# Patient Record
Sex: Male | Born: 1950 | Race: White | Hispanic: No | Smoking: Never smoker
Health system: Southern US, Community
[De-identification: ages and names within clinical notes are randomized; demographics above are authoritative.]

## PROBLEM LIST (undated history)

## (undated) DIAGNOSIS — E785 Hyperlipidemia, unspecified: Secondary | ICD-10-CM

## (undated) DIAGNOSIS — E1110 Type 2 diabetes mellitus with ketoacidosis without coma: Secondary | ICD-10-CM

## (undated) DIAGNOSIS — I10 Essential (primary) hypertension: Secondary | ICD-10-CM

## (undated) DIAGNOSIS — I639 Cerebral infarction, unspecified: Secondary | ICD-10-CM

## (undated) DIAGNOSIS — N529 Male erectile dysfunction, unspecified: Secondary | ICD-10-CM

## (undated) DIAGNOSIS — F4323 Adjustment disorder with mixed anxiety and depressed mood: Secondary | ICD-10-CM

## (undated) DIAGNOSIS — E119 Type 2 diabetes mellitus without complications: Principal | ICD-10-CM

## (undated) DIAGNOSIS — R972 Elevated prostate specific antigen [PSA]: Secondary | ICD-10-CM

## (undated) HISTORY — DX: Type 2 diabetes mellitus without complications: E11.9

## (undated) HISTORY — DX: Elevated prostate specific antigen (PSA): R97.20

## (undated) HISTORY — DX: Adjustment disorder with mixed anxiety and depressed mood: F43.23

## (undated) HISTORY — DX: Male erectile dysfunction, unspecified: N52.9

## (undated) HISTORY — DX: Essential (primary) hypertension: I10

## (undated) HISTORY — DX: Hyperlipidemia, unspecified: E78.5

---

## 2001-11-10 ENCOUNTER — Encounter: Admission: RE | Admit: 2001-11-10 | Discharge: 2002-02-08 | Payer: Self-pay | Admitting: Internal Medicine

## 2004-04-05 ENCOUNTER — Ambulatory Visit: Payer: Self-pay | Admitting: Internal Medicine

## 2004-04-12 ENCOUNTER — Ambulatory Visit (HOSPITAL_COMMUNITY): Admission: RE | Admit: 2004-04-12 | Discharge: 2004-04-12 | Payer: Self-pay | Admitting: Internal Medicine

## 2004-04-25 ENCOUNTER — Ambulatory Visit: Payer: Self-pay | Admitting: Endocrinology

## 2004-05-30 ENCOUNTER — Ambulatory Visit: Payer: Self-pay | Admitting: Internal Medicine

## 2004-07-06 ENCOUNTER — Ambulatory Visit: Payer: Self-pay | Admitting: Endocrinology

## 2005-10-10 ENCOUNTER — Ambulatory Visit: Payer: Self-pay | Admitting: Internal Medicine

## 2006-11-25 ENCOUNTER — Telehealth: Payer: Self-pay | Admitting: Internal Medicine

## 2006-12-12 ENCOUNTER — Telehealth: Payer: Self-pay | Admitting: Internal Medicine

## 2006-12-24 ENCOUNTER — Ambulatory Visit: Payer: Self-pay | Admitting: Internal Medicine

## 2006-12-24 DIAGNOSIS — E785 Hyperlipidemia, unspecified: Secondary | ICD-10-CM | POA: Insufficient documentation

## 2006-12-24 DIAGNOSIS — I10 Essential (primary) hypertension: Secondary | ICD-10-CM

## 2006-12-24 DIAGNOSIS — F411 Generalized anxiety disorder: Secondary | ICD-10-CM

## 2006-12-24 DIAGNOSIS — E119 Type 2 diabetes mellitus without complications: Secondary | ICD-10-CM

## 2006-12-24 DIAGNOSIS — F329 Major depressive disorder, single episode, unspecified: Secondary | ICD-10-CM

## 2006-12-24 HISTORY — DX: Essential (primary) hypertension: I10

## 2006-12-24 HISTORY — DX: Hyperlipidemia, unspecified: E78.5

## 2006-12-24 HISTORY — DX: Type 2 diabetes mellitus without complications: E11.9

## 2006-12-25 ENCOUNTER — Encounter: Payer: Self-pay | Admitting: Internal Medicine

## 2006-12-25 DIAGNOSIS — R972 Elevated prostate specific antigen [PSA]: Secondary | ICD-10-CM

## 2006-12-25 HISTORY — DX: Elevated prostate specific antigen (PSA): R97.20

## 2006-12-27 ENCOUNTER — Ambulatory Visit: Payer: Self-pay | Admitting: Endocrinology

## 2007-02-03 ENCOUNTER — Encounter: Payer: Self-pay | Admitting: Internal Medicine

## 2007-06-16 ENCOUNTER — Emergency Department (HOSPITAL_COMMUNITY): Admission: EM | Admit: 2007-06-16 | Discharge: 2007-06-16 | Payer: Self-pay | Admitting: Emergency Medicine

## 2007-06-20 ENCOUNTER — Telehealth: Payer: Self-pay | Admitting: Internal Medicine

## 2007-06-24 ENCOUNTER — Ambulatory Visit: Payer: Self-pay | Admitting: Internal Medicine

## 2007-06-24 DIAGNOSIS — R634 Abnormal weight loss: Secondary | ICD-10-CM | POA: Insufficient documentation

## 2007-06-24 LAB — CONVERTED CEMR LAB
AST: 23 units/L (ref 0–37)
Alkaline Phosphatase: 59 units/L (ref 39–117)
Basophils Absolute: 0 10*3/uL (ref 0.0–0.1)
Basophils Relative: 0.5 % (ref 0.0–1.0)
Bilirubin, Direct: 0.1 mg/dL (ref 0.0–0.3)
Calcium: 8.9 mg/dL (ref 8.4–10.5)
Chloride: 105 meq/L (ref 96–112)
Eosinophils Absolute: 0.1 10*3/uL (ref 0.0–0.7)
Glucose, Bld: 105 mg/dL — ABNORMAL HIGH (ref 70–99)
HCT: 41.7 % (ref 39.0–52.0)
LDL Cholesterol: 89 mg/dL (ref 0–99)
MCHC: 33.4 g/dL (ref 30.0–36.0)
Monocytes Absolute: 0.4 10*3/uL (ref 0.1–1.0)
Monocytes Relative: 7 % (ref 3.0–12.0)
Neutrophils Relative %: 70.4 % (ref 43.0–77.0)
Potassium: 4.3 meq/L (ref 3.5–5.1)
RBC: 4.75 M/uL (ref 4.22–5.81)
Sodium: 139 meq/L (ref 135–145)
Total CHOL/HDL Ratio: 3.2
WBC: 5.6 10*3/uL (ref 4.5–10.5)

## 2008-07-26 ENCOUNTER — Telehealth: Payer: Self-pay | Admitting: Internal Medicine

## 2008-08-20 ENCOUNTER — Ambulatory Visit: Payer: Self-pay | Admitting: Internal Medicine

## 2008-08-20 LAB — CONVERTED CEMR LAB
AST: 25 units/L (ref 0–37)
BUN: 18 mg/dL (ref 6–23)
CO2: 30 meq/L (ref 19–32)
Chloride: 105 meq/L (ref 96–112)
Cholesterol: 157 mg/dL (ref 0–200)
Creatinine, Ser: 1 mg/dL (ref 0.4–1.5)
Creatinine,U: 149.2 mg/dL
Eosinophils Relative: 2.5 % (ref 0.0–5.0)
GFR calc non Af Amer: 81.17 mL/min (ref >60)
Hemoglobin: 14.6 g/dL (ref 13.0–17.0)
Ketones, ur: NEGATIVE mg/dL
Leukocytes, UA: NEGATIVE
Lymphs Abs: 1.1 10*3/uL (ref 0.7–4.0)
Microalb Creat Ratio: 5.4 mg/g (ref 0.0–30.0)
Microalb, Ur: 0.8 mg/dL (ref 0.0–1.9)
Monocytes Absolute: 0.4 10*3/uL (ref 0.1–1.0)
Neutrophils Relative %: 66.5 % (ref 43.0–77.0)
Nitrite: NEGATIVE
Platelets: 194 10*3/uL (ref 150.0–400.0)
RBC: 5.03 M/uL (ref 4.22–5.81)
RDW: 12.4 % (ref 11.5–14.6)
Specific Gravity, Urine: 1.015 (ref 1.000–1.030)
Total Bilirubin: 1 mg/dL (ref 0.3–1.2)
Total CHOL/HDL Ratio: 3
Total Protein: 7 g/dL (ref 6.0–8.3)
Triglycerides: 74 mg/dL (ref 0.0–149.0)
VLDL: 14.8 mg/dL (ref 0.0–40.0)

## 2008-08-26 ENCOUNTER — Ambulatory Visit: Payer: Self-pay | Admitting: Internal Medicine

## 2008-08-26 DIAGNOSIS — Z860100 Personal history of colon polyps, unspecified: Secondary | ICD-10-CM | POA: Insufficient documentation

## 2009-01-18 ENCOUNTER — Encounter (INDEPENDENT_AMBULATORY_CARE_PROVIDER_SITE_OTHER): Payer: Self-pay | Admitting: *Deleted

## 2009-02-17 ENCOUNTER — Ambulatory Visit: Payer: Self-pay | Admitting: Internal Medicine

## 2009-02-17 LAB — CONVERTED CEMR LAB
CO2: 30 meq/L (ref 19–32)
Calcium: 8.8 mg/dL (ref 8.4–10.5)
Cholesterol: 148 mg/dL (ref 0–200)
Creatinine, Ser: 1 mg/dL (ref 0.4–1.5)
Glucose, Bld: 107 mg/dL — ABNORMAL HIGH (ref 70–99)
HDL: 56.8 mg/dL (ref >39.00)
LDL Cholesterol: 77 mg/dL (ref 0–99)
Potassium: 4.5 meq/L (ref 3.5–5.1)
Sodium: 138 meq/L (ref 135–145)
VLDL: 14 mg/dL (ref 0.0–40.0)

## 2009-02-23 ENCOUNTER — Ambulatory Visit: Payer: Self-pay | Admitting: Internal Medicine

## 2009-08-18 ENCOUNTER — Ambulatory Visit: Payer: Self-pay | Admitting: Internal Medicine

## 2009-08-18 LAB — CONVERTED CEMR LAB
Alkaline Phosphatase: 63 units/L (ref 39–117)
Basophils Relative: 0.3 % (ref 0.0–3.0)
Bilirubin, Direct: 0.3 mg/dL (ref 0.0–0.3)
CO2: 31 meq/L (ref 19–32)
Chloride: 103 meq/L (ref 96–112)
Cholesterol: 164 mg/dL (ref 0–200)
Creatinine,U: 141.2 mg/dL
Eosinophils Absolute: 0.1 10*3/uL (ref 0.0–0.7)
Eosinophils Relative: 2.3 % (ref 0.0–5.0)
Hemoglobin, Urine: NEGATIVE
Hgb A1c MFr Bld: 7.9 % — ABNORMAL HIGH (ref 4.6–6.5)
LDL Cholesterol: 94 mg/dL (ref 0–99)
Lymphs Abs: 1 10*3/uL (ref 0.7–4.0)
MCV: 85 fL (ref 78.0–100.0)
Microalb Creat Ratio: 0.4 mg/g (ref 0.0–30.0)
Microalb, Ur: 0.5 mg/dL (ref 0.0–1.9)
Monocytes Relative: 12.1 % — ABNORMAL HIGH (ref 3.0–12.0)
Neutro Abs: 2.6 10*3/uL (ref 1.4–7.7)
Platelets: 177 10*3/uL (ref 150.0–400.0)
Potassium: 4.8 meq/L (ref 3.5–5.1)
RBC: 4.92 M/uL (ref 4.22–5.81)
Specific Gravity, Urine: 1.015 (ref 1.000–1.030)
TSH: 1.15 microintl units/mL (ref 0.35–5.50)
Total Protein, Urine: NEGATIVE mg/dL
Triglycerides: 104 mg/dL (ref 0.0–149.0)
VLDL: 20.8 mg/dL (ref 0.0–40.0)

## 2009-08-22 ENCOUNTER — Ambulatory Visit: Payer: Self-pay | Admitting: Internal Medicine

## 2010-01-29 ENCOUNTER — Encounter: Payer: Self-pay | Admitting: Internal Medicine

## 2010-02-05 LAB — CONVERTED CEMR LAB
ALT: 33 units/L (ref 0–53)
AST: 28 units/L (ref 0–37)
Albumin: 3.9 g/dL (ref 3.5–5.2)
Alkaline Phosphatase: 82 units/L (ref 39–117)
BUN: 11 mg/dL (ref 6–23)
Basophils Absolute: 0 10*3/uL (ref 0.0–0.1)
Basophils Relative: 0.6 % (ref 0.0–1.0)
Bilirubin Urine: NEGATIVE
Bilirubin, Direct: 0.2 mg/dL (ref 0.0–0.3)
CO2: 30 meq/L (ref 19–32)
Calcium: 9.1 mg/dL (ref 8.4–10.5)
Chloride: 99 meq/L (ref 96–112)
Cholesterol: 231 mg/dL (ref 0–200)
Creatinine, Ser: 0.9 mg/dL (ref 0.4–1.5)
Creatinine,U: 184.8 mg/dL
Direct LDL: 141.8 mg/dL
Eosinophils Absolute: 0.1 10*3/uL (ref 0.0–0.6)
Eosinophils Relative: 2.1 % (ref 0.0–5.0)
GFR calc Af Amer: 112 mL/min
GFR calc non Af Amer: 92 mL/min
Glucose, Bld: 225 mg/dL — ABNORMAL HIGH (ref 70–99)
HCT: 48.7 % (ref 39.0–52.0)
HDL: 54 mg/dL (ref >39.0)
Hemoglobin, Urine: NEGATIVE
Hemoglobin: 16.4 g/dL (ref 13.0–17.0)
Hgb A1c MFr Bld: 12.5 % — ABNORMAL HIGH (ref 4.6–6.0)
Ketones, ur: NEGATIVE mg/dL
Leukocytes, UA: NEGATIVE
Lymphocytes Relative: 21.5 % (ref 12.0–46.0)
MCHC: 33.6 g/dL (ref 30.0–36.0)
MCV: 85.1 fL (ref 78.0–100.0)
Microalb Creat Ratio: 10.3 mg/g (ref 0.0–30.0)
Microalb, Ur: 1.9 mg/dL (ref 0.0–1.9)
Monocytes Absolute: 0.3 10*3/uL (ref 0.2–0.7)
Monocytes Relative: 4.6 % (ref 3.0–11.0)
Neutro Abs: 4.5 10*3/uL (ref 1.4–7.7)
Neutrophils Relative %: 71.2 % (ref 43.0–77.0)
Nitrite: NEGATIVE
PSA: 3.02 ng/mL (ref 0.10–4.00)
Platelets: 232 10*3/uL (ref 150–400)
Potassium: 4.5 meq/L (ref 3.5–5.1)
RBC: 5.72 M/uL (ref 4.22–5.81)
RDW: 13.1 % (ref 11.5–14.6)
Sodium: 137 meq/L (ref 135–145)
Specific Gravity, Urine: 1.02 (ref 1.000–1.03)
TSH: 0.92 microintl units/mL (ref 0.35–5.50)
Total Bilirubin: 1.2 mg/dL (ref 0.3–1.2)
Total CHOL/HDL Ratio: 4.3
Total Protein: 7.5 g/dL (ref 6.0–8.3)
Triglycerides: 176 mg/dL — ABNORMAL HIGH (ref 0–149)
Urine Glucose: >=1000 mg/dL — CR
Urobilinogen, UA: 0.2 (ref 0.0–1.0)
VLDL: 35 mg/dL (ref 0–40)
WBC: 6.2 10*3/uL (ref 4.5–10.5)
pH: 6.5 (ref 5.0–8.0)

## 2010-02-07 NOTE — Assessment & Plan Note (Signed)
Summary: 6 MTH FU  STC   Vital Signs:  Patient profile:   60 year old male Height:      72 inches Weight:      181.50 pounds BMI:     24.70 O2 Sat:      97 % on Room air Temp:     98.3 degrees F oral Pulse rate:   83 / minute BP sitting:   112 / 68  (left arm) Cuff size:   regular  Vitals Entered By: Zella Ball Ewing CMA (AAMA) (August 22, 2009 8:05 AM)  O2 Flow:  Room air  Preventive Care Screening     declines colonoscopy due to cost   CC: 6 month followup/RE   CC:  6 month followup/RE.  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 Take 2 Tablet By Mouth Once A Day 2)  Lovastatin 40 Mg  Tabs (Lovastatin) .Marland Kitchen.. 1 By Mouth Once Daily 3)  Actos 45 Mg  Tabs (Pioglitazone  Hcl) .Marland Kitchen.. 1 By Mouth Once Daily 4)  Amaryl 4 Mg  Tabs (Glimepiride) .... 2 Tabs By Mouth Once Daily 5)  Ecotrin Low Strength 81 Mg  Tbec (Aspirin) .Marland Kitchen.. 1 By Mouth Qd 6)  Januvia 100 Mg  Tabs (Sitagliptin Phosphate) .Marland Kitchen.. 1 By Mouth Once Daily 7)  Lisinopril 5 Mg  Tabs (Lisinopril) .Marland Kitchen.. 1 By Mouth Once Daily  Current Medications (verified): 1)  Metformin Hcl 500 Mg Tb24 (Metformin Hcl) .... Take 2 Tablet By Mouth Once A Day 2)  Lovastatin 40 Mg  Tabs (Lovastatin) .Marland Kitchen.. 1 By Mouth Once Daily 3)  Actos 45 Mg  Tabs (Pioglitazone Hcl) .Marland Kitchen.. 1 By Mouth Once Daily 4)  Amaryl 4 Mg  Tabs (Glimepiride) .... 2 Tabs By Mouth Once Daily 5)  Ecotrin Low Strength 81 Mg  Tbec (Aspirin) .Marland Kitchen.. 1 By Mouth Qd 6)  Januvia 100 Mg  Tabs (Sitagliptin Phosphate) .Marland Kitchen.. 1 By Mouth Once Daily 7)  Lisinopril 5 Mg  Tabs (Lisinopril) .Marland Kitchen.. 1 By Mouth Once Daily 8)  Onetouch Ultra Test  Strp (Glucose Blood) .... Use Asd 1 Once Daily 9)  Lancets  Misc (Lancets) .... Use Asd 1 Once Daily  Allergies (verified): No  Known Drug Allergies  Past History:  Past Medical History: Last updated: 12/24/2006 Diabetes mellitus, type II Hypertension Hyperlipidemia Anxiety Depression  Past Surgical History: Last updated: 12/24/2006 Denies surgical history  Family History: Last updated: 12/27/2006 DM father with colon cancer, died age 53 mother with uterine cancer and dm  Social History: Last updated: 06/24/2007 Never Smoked Alcohol use-yes pt states  business conditions are poor/[employer] Married 2 children  Risk Factors: Smoking Status: never (12/24/2006)  Review of Systems  The patient denies anorexia, fever, vision loss, decreased hearing, hoarseness, chest pain, syncope, dyspnea on exertion, peripheral edema, prolonged cough, headaches, hemoptysis, abdominal pain, melena, hematochezia, severe indigestion/heartburn, hematuria, muscle weakness, suspicious skin lesions, transient blindness, difficulty  walking, depression, unusual weight change, abnormal bleeding, enlarged lymph nodes, and angioedema.         all otherwise negative per pt -   - declines tx for midl to mod depression  Physical Exam  General:  alert and well-developed.   Head:  normocephalic and atraumatic.   Eyes:  vision grossly intact, pupils equal, and pupils round.   Ears:  R ear normal and L ear normal.   Nose:  no external deformity and no nasal discharge.   Mouth:  no gingival abnormalities and pharynx pink and moist.   Neck:  supple and no masses.   Lungs:  normal respiratory effort and normal breath sounds.   Heart:  normal rate and regular rhythm.   Abdomen:  soft, non-tender, and normal bowel sounds.   Msk:  no joint tenderness and no joint swelling.   Extremities:  no edema, no erythema  Neurologic:  cranial nerves II-XII intact and strength normal in all extremities.   Skin:  color normal and no rashes.   Psych:  dysphoric affect and moderately anxious.     Impression & Recommendations:  Problem # 1:  Preventive Health Care (ICD-V70.0) Overall doing well, age appropriate education and counseling updated and referral for appropriate preventive services done unless declined, immunizations up to date or declined, diet counseling done if overweight, urged to quit smoking if smokes , most recent labs reviewed and current ordered if appropriate, ecg reviewed or declined (interpretation per ECG scanned in the EMR if done); information regarding Medicare Prevention requirements given if appropriate; speciality referrals updated as appropriate   Problem # 2:  DIABETES MELLITUS, TYPE II (ICD-250.00)  His updated medication list for this problem includes:    Metformin Hcl 500 Mg Tb24 (Metformin hcl) .Marland Kitchen... Take 2 tablet by mouth once a day    Actos 45 Mg Tabs (Pioglitazone hcl) .Marland Kitchen... 1 by mouth once daily    Amaryl 4 Mg Tabs (Glimepiride) .Marland Kitchen... 2 tabs by mouth once daily    Ecotrin Low Strength 81 Mg Tbec  (Aspirin) .Marland Kitchen... 1 by mouth qd    Januvia 100 Mg Tabs (Sitagliptin phosphate) .Marland Kitchen... 1 by mouth once daily    Lisinopril 5 Mg Tabs (Lisinopril) .Marland Kitchen... 1 by mouth once daily  Labs Reviewed: Creat: 1.0 (08/18/2009)    Reviewed HgBA1c results: 7.9 (08/18/2009)  7.1 (02/17/2009) some uncontrolled recently, but back to better diet, declines med change;  ok to cont same meds; gave new glucometer and supplies  Problem # 3:  PSA, INCREASED (ICD-790.93) advised referral to urology, he delcines  Problem # 4:  DEPRESSION (ICD-311) mild, declines further tx at this time  Complete Medication List: 1)  Metformin Hcl 500 Mg Tb24 (Metformin hcl) .... Take 2 tablet by mouth once a day 2)  Lovastatin 40 Mg Tabs (Lovastatin) .Marland Kitchen.. 1 by mouth once daily 3)  Actos 45 Mg Tabs (Pioglitazone hcl) .Marland Kitchen.. 1 by mouth once daily 4)  Amaryl 4 Mg Tabs (Glimepiride) .... 2 tabs by mouth once daily 5)  Ecotrin Low Strength 81 Mg Tbec (Aspirin) .Marland Kitchen.. 1 by mouth qd 6)  Januvia 100 Mg Tabs (Sitagliptin phosphate) .Marland Kitchen.. 1 by mouth once daily 7)  Lisinopril 5 Mg Tabs (Lisinopril) .Marland Kitchen.. 1 by mouth once daily 8)  Onetouch Ultra Test Strp (Glucose blood) .... Use asd 1 once daily 9)  Lancets Misc (Lancets) .... Use asd 1 once daily  Patient Instructions: 1)  Continue all previous medications as before this visit  2)  Please call if you would like to be referred for the colonosocpy and the urology referral 3)  Please schedule a follow-up appointment in 6 months with : 4)  BMP prior to visit, ICD-9: 250.02 5)  Lipid Panel prior to visit, ICD-9: 6)  HbgA1C prior to visit, ICD-9: 7)  PSA:   790.93 Prescriptions: LISINOPRIL 5 MG  TABS (LISINOPRIL) 1 by mouth once daily  #90 x 3   Entered and Authorized by:   Corwin Levins MD   Signed by:   Corwin Levins MD on 08/22/2009   Method used:   Print then Give to Patient   RxID:   [id number] JANUVIA 100 MG  TABS (SITAGLIPTIN PHOSPHATE) 1 by mouth once daily  #90 x 3   Entered and  Authorized by:   Corwin Levins MD   Signed by:   Corwin Levins MD on 08/22/2009   Method used:   Print then Give to Patient   RxID:   [id number] AMARYL 4 MG  TABS (GLIMEPIRIDE) 2 tabs by mouth once daily  #180 x 3   Entered and Authorized by:   Corwin Levins MD   Signed by:   Corwin Levins MD on 08/22/2009   Method used:   Print then Give to Patient   RxID:   [id number] ACTOS 45 MG  TABS (PIOGLITAZONE HCL) 1 by mouth once daily  #90 x 3   Entered and Authorized by:   Corwin Levins MD   Signed by:   Corwin Levins MD on 08/22/2009   Method used:   Print then Give to Patient   RxID:   [id number] LOVASTATIN 40 MG  TABS (LOVASTATIN) 1 by mouth once daily  #90 x 3   Entered and Authorized by:   Corwin Levins MD   Signed by:   Corwin Levins MD on 08/22/2009   Method used:   Print then Give to Patient   RxID:   [id number] METFORMIN HCL 500 MG TB24 (METFORMIN HCL) Take 2 tablet by mouth once a day  #180 x 3   Entered and Authorized by:   Corwin Levins MD   Signed by:   Corwin Levins MD on 08/22/2009   Method used:   Print then Give to Patient   RxID:   [id number] LANCETS  MISC (LANCETS) use asd 1 once daily  #100 x 11   Entered and Authorized by:   Corwin Levins MD   Signed by:   Corwin Levins MD on 08/22/2009   Method used:   Print then Give to Patient   RxID:   [id number] ONETOUCH ULTRA TEST  STRP (GLUCOSE BLOOD) use asd 1 once daily  #100 x 11   Entered and Authorized by:  Corwin Levins MD   Signed by:   Corwin Levins MD on 08/22/2009   Method used:   Print then Give to Patient   RxID:   [id number]

## 2010-02-07 NOTE — Assessment & Plan Note (Signed)
Summary: 6 MO ROV /NWS $50   Vital Signs:  Patient profile:   60 year old male Height:      72 inches Weight:      181 pounds BMI:     24.64 O2 Sat:      98 % on Room air Temp:     98.1 degrees F oral Pulse rate:   78 / minute BP sitting:   112 / 70  (left arm) Cuff size:   regular  Vitals Entered ByZella Ball Ewing (February 23, 2009 8:07 AM)  O2 Flow:  Room air  CC: 6 mo ROV/RE   CC:  6 mo ROV/RE.  History of Present Illness: overall much improved with DM - lsot from approx 200 2 to 3 yrs ago, now to 181 with overall good ocmplaicne and toleration of meds, initial a1c has been as high as 13%;  Pt denies CP, sob, doe, wheezing, orthopnea, pnd, worsening LE edema, palps, dizziness or syncope  .  Pt denies new neuro symptoms such as headache, facial or extremity weakness Pt denies polydipsia, polyuria, or low sugar symptoms such as shakiness improved with eating.  Overall good compliance with meds, trying to follow low chol, DM diet, wt stable, little excercise however Wt stabel now, walking the dog nearly daily half mile (older dog);  still working .    Problems Prior to Update: 1)  Colonic Polyps, Hx of  (ICD-V12.72) 2)  Weight Loss  (ICD-783.21) 3)  Hepatotoxicity, Drug-induced, Risk of  (ICD-V58.69) 4)  Psa, Increased  (ICD-790.93) 5)  Depression  (ICD-311) 6)  Anxiety  (ICD-300.00) 7)  Hyperlipidemia  (ICD-272.4) 8)  Hypertension  (ICD-401.9) 9)  Diabetes Mellitus, Type II  (ICD-250.00) 10)  Preventive Health Care  (ICD-V70.0)  Medications Prior to Update: 1)  Metformin Hcl 500 Mg Tb24 (Metformin Hcl) .... Take 2 Tablet By Mouth Once A Day 2)  Lovastatin 40 Mg  Tabs (Lovastatin) .Marland Kitchen.. 1 By Mouth Once Daily 3)  Actos 45 Mg  Tabs (Pioglitazone Hcl) .Marland Kitchen.. 1 By Mouth Once Daily 4)  Amaryl 4 Mg  Tabs (Glimepiride) .... 2 Tabs By Mouth Once Daily 5)  Ecotrin Low Strength 81 Mg  Tbec (Aspirin) .Marland Kitchen.. 1 By Mouth Qd 6)  Januvia 100 Mg  Tabs (Sitagliptin Phosphate) .Marland Kitchen.. 1 By Mouth  Once Daily 7)  Lisinopril 5 Mg  Tabs (Lisinopril) .Marland Kitchen.. 1 By Mouth Once Daily  Current Medications (verified): 1)  Metformin Hcl 500 Mg Tb24 (Metformin Hcl) .... Take 2 Tablet By Mouth Once A Day 2)  Lovastatin 40 Mg  Tabs (Lovastatin) .Marland Kitchen.. 1 By Mouth Once Daily 3)  Actos 45 Mg  Tabs (Pioglitazone Hcl) .Marland Kitchen.. 1 By Mouth Once Daily 4)  Amaryl 4 Mg  Tabs (Glimepiride) .... 2 Tabs By Mouth Once Daily 5)  Ecotrin Low Strength 81 Mg  Tbec (Aspirin) .Marland Kitchen.. 1 By Mouth Qd 6)  Januvia 100 Mg  Tabs (Sitagliptin Phosphate) .Marland Kitchen.. 1 By Mouth Once Daily 7)  Lisinopril 5 Mg  Tabs (Lisinopril) .Marland Kitchen.. 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   Head:  normocephalic and atraumatic.   Eyes:  vision grossly intact, pupils equal, and pupils round.   Ears:  R ear normal and L ear normal.   Nose:  no external deformity and no nasal discharge.   Mouth:  no gingival abnormalities and pharynx pink and moist.   Neck:  supple and no masses.   Lungs:  normal respiratory effort and normal breath sounds.   Heart:  normal rate and regular rhythm.   Extremities:  no edema, no erythema    Impression & Recommendations:  Problem # 1:  DIABETES MELLITUS, TYPE II (ICD-250.00)  His updated medication list for this problem includes:    Metformin Hcl 500 Mg Tb24 (Metformin hcl) .Marland Kitchen... Take 2 tablet by mouth once a day    Actos 45 Mg Tabs (Pioglitazone hcl) .Marland Kitchen... 1  by mouth once daily    Amaryl 4 Mg Tabs (Glimepiride) .Marland Kitchen... 2 tabs by mouth once daily    Ecotrin Low Strength 81 Mg Tbec (Aspirin) .Marland Kitchen... 1 by mouth qd    Januvia 100 Mg Tabs (Sitagliptin phosphate) .Marland Kitchen... 1 by mouth once daily    Lisinopril 5 Mg Tabs (Lisinopril) .Marland Kitchen... 1 by mouth once daily  Labs Reviewed: Creat: 1.0 (02/17/2009)    Reviewed HgBA1c results: 7.1 (02/17/2009)  7.0 (08/20/2008) stable overall by hx and exam, ok to continue meds/tx as is   Problem # 2:  HYPERTENSION (ICD-401.9)  His updated medication list for this problem includes:    Lisinopril 5 Mg Tabs (Lisinopril) .Marland Kitchen... 1 by mouth once daily  BP today: 112/70 Prior BP: 114/68 (08/26/2008)  Labs Reviewed: K+: 4.5 (02/17/2009) Creat: : 1.0 (02/17/2009)   Chol: 148 (02/17/2009)   HDL: 56.80 (02/17/2009)   LDL: 77 (02/17/2009)   TG: 70.0 (02/17/2009) stable overall by hx and exam, ok to continue meds/tx as is   Problem # 3:  HYPERLIPIDEMIA (ICD-272.4)  His updated medication list for this problem includes:    Lovastatin 40 Mg Tabs (Lovastatin) .Marland Kitchen... 1 by mouth once daily  Labs Reviewed: SGOT: 25 (08/20/2008)   SGPT: 22 (08/20/2008)   HDL:56.80 (02/17/2009), 50.00 (08/20/2008)  LDL:77 (02/17/2009), 92 (08/20/2008)  Chol:148 (02/17/2009), 157 (08/20/2008)  Trig:70.0 (02/17/2009), 74.0 (08/20/2008) stable overall by hx and exam, ok to continue meds/tx as is   Complete Medication List: 1)  Metformin Hcl 500 Mg Tb24 (Metformin hcl) .... Take 2 tablet by mouth once a day 2)  Lovastatin 40 Mg Tabs (Lovastatin) .Marland Kitchen.. 1 by mouth once daily 3)  Actos 45 Mg Tabs (Pioglitazone hcl) .Marland Kitchen.. 1 by mouth once daily 4)  Amaryl 4 Mg Tabs (Glimepiride) .... 2 tabs by mouth once daily 5)  Ecotrin Low Strength 81 Mg Tbec (Aspirin) .Marland Kitchen.. 1 by mouth qd 6)  Januvia 100 Mg Tabs (Sitagliptin phosphate) .Marland Kitchen.. 1 by mouth once daily 7)  Lisinopril 5 Mg Tabs (Lisinopril) .Marland Kitchen.. 1 by mouth once daily  Other Orders: Pneumococcal Vaccine  (16109) Admin 1st Vaccine (60454)  Patient Instructions: 1)  Continue all previous medications as before this visit  2)  consider change januvia to onglyza 3)  please call your insurance to see if the shingles shot is covered 4)  you had the pneumonia shot today (this is given every 5 yrs) 5)  Please schedule a follow-up appointment in 6 months with CPX labs and: 6)  HbgA1C prior to visit, ICD-9: 250.02 7)  Urine Microalbumin prior to visit,  ICD-9:   Immunization History:  Influenza Immunization History:    Influenza:  historical (11/08/2008)  Immunizations Administered:  Pneumonia Vaccine:    Vaccine Type: Pneumovax    Site: right deltoid    Mfr: Merck    Dose: 0.5 ml    Route: IM    Given by: Robin Ewing    Exp. Date: 02/03/2010    Lot #: [id number]    VIS given: 08/06/95 version given February 23, 2009.

## 2010-02-07 NOTE — Letter (Signed)
Summary: Referral - not able to see patient  [facility]  [address] [place], [place] [address]   Phone: [phone]  Fax: [phone]    January 18, 2009   Re:   Jake Chen DOB:  [birth year] MRN:   [id number]    Dear Dr. [name]:  Thank you for your kind referral of the above patient.  We have attempted to schedule the recommended procedure for a Colonoscopy but have not been able to schedule because:   X  The patient was not available by phone and/or has not returned our calls.  ___ The patient declined to schedule the procedure at this time.  We appreciate the referral and hope that we will have the opportunity to treat this patient in the future.    Sincerely,  [facility] [phone]

## 2010-02-16 ENCOUNTER — Other Ambulatory Visit: Payer: Self-pay

## 2010-02-20 ENCOUNTER — Other Ambulatory Visit: Payer: Self-pay

## 2010-02-20 ENCOUNTER — Encounter (INDEPENDENT_AMBULATORY_CARE_PROVIDER_SITE_OTHER): Payer: Self-pay | Admitting: *Deleted

## 2010-02-20 ENCOUNTER — Other Ambulatory Visit: Payer: Self-pay | Admitting: Internal Medicine

## 2010-02-20 DIAGNOSIS — R972 Elevated prostate specific antigen [PSA]: Secondary | ICD-10-CM

## 2010-02-20 DIAGNOSIS — IMO0001 Reserved for inherently not codable concepts without codable children: Secondary | ICD-10-CM

## 2010-02-20 DIAGNOSIS — E1165 Type 2 diabetes mellitus with hyperglycemia: Secondary | ICD-10-CM

## 2010-02-20 LAB — BASIC METABOLIC PANEL
BUN: 15 mg/dL (ref 6–23)
Creatinine, Ser: 0.9 mg/dL (ref 0.4–1.5)
GFR: 86.74 mL/min (ref >60.00)
Glucose, Bld: 154 mg/dL — ABNORMAL HIGH (ref 70–99)

## 2010-02-20 LAB — LIPID PANEL
Cholesterol: 187 mg/dL (ref 0–200)
HDL: 52.5 mg/dL (ref >39.00)
LDL Cholesterol: 108 mg/dL — ABNORMAL HIGH (ref 0–99)
Total CHOL/HDL Ratio: 4
VLDL: 26.8 mg/dL (ref 0.0–40.0)

## 2010-02-23 ENCOUNTER — Ambulatory Visit (INDEPENDENT_AMBULATORY_CARE_PROVIDER_SITE_OTHER): Payer: BC Managed Care – PPO | Admitting: Internal Medicine

## 2010-02-23 ENCOUNTER — Encounter: Payer: Self-pay | Admitting: Internal Medicine

## 2010-02-23 DIAGNOSIS — F4323 Adjustment disorder with mixed anxiety and depressed mood: Secondary | ICD-10-CM

## 2010-02-23 DIAGNOSIS — I10 Essential (primary) hypertension: Secondary | ICD-10-CM

## 2010-02-23 DIAGNOSIS — E119 Type 2 diabetes mellitus without complications: Secondary | ICD-10-CM

## 2010-02-23 HISTORY — DX: Adjustment disorder with mixed anxiety and depressed mood: F43.23

## 2010-03-01 NOTE — Assessment & Plan Note (Signed)
Summary: 6 MO ROV/NWS #   Vital Signs:  Patient profile:   60 year old male Height:      71 inches Weight:      188.38 pounds BMI:     26.37 O2 Sat:      94 % on Room air Temp:     97.8 degrees F oral Pulse rate:   75 / minute BP sitting:   112 / 68  (left arm) Cuff size:   regular  Vitals Entered By: Zella Ball Ewing CMA (AAMA) (February 23, 2010 8:02 AM)  O2 Flow:  Room air CC: 6 month ROV/RE   CC:  6 month ROV/RE.  History of Present Illness: here for routine f/u, but as it turns out he becomes nervous, tearful and breaks down when relating he was just informed yesterday he will be let go from his job at the end of the month for performance/unable to make goals.  Denie suicidal ideation, or panic, but low mood today and anxious in that he is quite uncertain about his future employment opportunities at his age and skills.  Relates his concerns, and feels like a failure, has no savings and cannot pay for new tires for car, and wife paid for recent vacation (she is still working).  Kept his appt today, but simply has no interest in discussion of sugars or other health issues, though he is taking his meds well for the most part.  Pt denies CP, worsening sob, doe, wheezing, orthopnea, pnd, worsening LE edema, palps, dizziness or syncope  Pt denies new neuro symptoms such as headache, facial or extremity weakness  Pt denies polydipsia, polyuria, or low sugar symptoms such as shakiness improved with eating.  Overall good compliance with meds, trying to follow low chol, DM diet, wt stable, little excercise however Not checking CBG's recently.  Preventive Screening-Counseling & Management      Drug Use:  no.    Problems Prior to Update: 1)  Preventive Health Care  (ICD-V70.0) 2)  Colonic Polyps, Hx of  (ICD-V12.72) 3)  Weight Loss  (ICD-783.21) 4)  Hepatotoxicity, Drug-induced, Risk of  (ICD-V58.69) 5)  Psa, Increased  (ICD-790.93) 6)  Depression  (ICD-311) 7)  Anxiety  (ICD-300.00) 8)   Hyperlipidemia  (ICD-272.4) 9)  Hypertension  (ICD-401.9) 10)  Diabetes Mellitus, Type II  (ICD-250.00) 11)  Preventive Health Care  (ICD-V70.0)  Medications Prior to Update: 1)  Metformin Hcl 500 Mg Tb24 (Metformin Hcl) .... Take 2 Tablet By Mouth Once A Day 2)  Lovastatin 40 Mg  Tabs (Lovastatin) .Marland Kitchen.. 1 By Mouth Once Daily 3)  Actos 45 Mg  Tabs (Pioglitazone Hcl) .Marland Kitchen.. 1 By Mouth Once Daily 4)  Amaryl 4 Mg  Tabs (Glimepiride) .... 2 Tabs By Mouth Once Daily 5)  Ecotrin Low Strength 81 Mg  Tbec (Aspirin) .Marland Kitchen.. 1 By Mouth Qd 6)  Januvia 100 Mg  Tabs (Sitagliptin Phosphate) .Marland Kitchen.. 1 By Mouth Once Daily 7)  Lisinopril 5 Mg  Tabs (Lisinopril) .Marland Kitchen.. 1 By Mouth Once Daily 8)  Onetouch Ultra Test  Strp (Glucose Blood) .... Use Asd 1 Once Daily 9)  Lancets  Misc (Lancets) .... Use Asd 1 Once Daily  Current Medications (verified): 1)  Metformin Hcl 500 Mg Tb24 (Metformin Hcl) .... Take 2 Tablet By Mouth Once A Day 2)  Lovastatin 40 Mg  Tabs (Lovastatin) .Marland Kitchen.. 1 By Mouth Once Daily 3)  Actos 45 Mg  Tabs (Pioglitazone Hcl) .Marland Kitchen.. 1 By Mouth Once Daily 4)  Amaryl 4 Mg  Tabs (Glimepiride) .... 2 Tabs By Mouth Once Daily 5)  Ecotrin Low Strength 81 Mg  Tbec (Aspirin) .Marland Kitchen.. 1 By Mouth Qd 6)  Januvia 100 Mg  Tabs (Sitagliptin Phosphate) .Marland Kitchen.. 1 By Mouth Once Daily 7)  Lisinopril 5 Mg  Tabs (Lisinopril) .Marland Kitchen.. 1 By Mouth Once Daily 8)  Onetouch Ultra Test  Strp (Glucose Blood) .... Use Asd 1 Once Daily 9)  Lancets  Misc (Lancets) .... Use Asd 1 Once Daily 10)  Diclofenac Sodium 75 Mg Tbec (Diclofenac Sodium) .Marland Kitchen.. 1po Two Times A Day As Needed Pain  Allergies (verified): No Known Drug Allergies  Past History:  Past Medical History: Last updated: 12/24/2006 Diabetes mellitus, type II Hypertension Hyperlipidemia Anxiety Depression  Past Surgical History: Last updated: 12/24/2006 Denies surgical history  Social History: Last updated: 02/23/2010 Never Smoked Alcohol use-yes pt states  business  conditions are poor/[employer] Married 2 children Drug use-no  Risk Factors: Smoking Status: never (12/24/2006)  Social History: Never Smoked Alcohol use-yes pt states  business conditions are poor/[employer] Married 2 children Drug use-no Drug Use:  no  Review of Systems       all otherwise negative per pt -    Physical Exam  General:  alert and well-developed.   Head:  normocephalic and atraumatic.   Eyes:  vision grossly intact, pupils equal, and pupils round.   Ears:  R ear normal and L ear normal.   Nose:  no external deformity and no nasal discharge.   Mouth:  no gingival abnormalities and pharynx pink and moist.   Neck:  supple and no masses.   Lungs:  normal respiratory effort and normal breath sounds.   Heart:  normal rate and regular rhythm.   Extremities:  no edema, no erythema  Psych:  depressed affect, tearful, and moderately anxious.     Impression & Recommendations:  Problem # 1:  ADJ DISORDER WITH MIXED ANXIETY & DEPRESSED MOOD (ICD-309.28) grief like symptoms today which overshadows any other concerns today;  tried to counsel and empathize today;  d/w pt referral for counseling but declines at thist time.  Verified with pt no suicidal ideation or plan.  Declines other tx today or med tx for this, or referral to pscyhiatry.  He plans to further counsel with wife over situation and let us know if any further medical support is needed.  Problem # 2:  HYPERTENSION (ICD-401.9)  His updated medication list for this problem includes:    Lisinopril 5 Mg Tabs (Lisinopril) .Marland Kitchen... 1 by mouth once daily  BP today: 112/68 Prior BP: 112/68 (08/22/2009)  Labs Reviewed: K+: 4.7 (02/20/2010) Creat: : 0.9 (02/20/2010)   Chol: 187 (02/20/2010)   HDL: 52.50 (02/20/2010)   LDL: 108 (02/20/2010)   TG: 134.0 (02/20/2010) stable overall by hx and exam, ok to continue meds/tx as is   Problem # 3:  DIABETES MELLITUS, TYPE II (ICD-250.00)  His updated  medication list for this problem includes:    Metformin Hcl 500 Mg Tb24 (Metformin hcl) .Marland Kitchen... Take 2 tablet by mouth once a day    Actos 45 Mg Tabs (Pioglitazone hcl) .Marland Kitchen... 1 by mouth once daily    Amaryl 4 Mg Tabs (Glimepiride) .Marland Kitchen... 2 tabs by mouth once daily    Ecotrin Low Strength 81 Mg Tbec (Aspirin) .Marland Kitchen... 1 by mouth qd    Januvia 100 Mg Tabs (Sitagliptin phosphate) .Marland Kitchen... 1 by mouth once daily    Lisinopril 5 Mg Tabs (Lisinopril) .Marland Kitchen... 1 by mouth  once daily  Labs Reviewed: Creat: 0.9 (02/20/2010)    Reviewed HgBA1c results: 8.6 (02/20/2010)  7.9 (08/18/2009) poor control overall, but simplly not interested in insulin start which is what he really needs; Continue all previous medications as before this visit , Pt to cont DM diet, excercise, wt control efforts; to check labs next visit  Complete Medication List: 1)  Metformin Hcl 500 Mg Tb24 (Metformin hcl) .... Take 2 tablet by mouth once a day 2)  Lovastatin 40 Mg Tabs (Lovastatin) .Marland Kitchen.. 1 by mouth once daily 3)  Actos 45 Mg Tabs (Pioglitazone hcl) .Marland Kitchen.. 1 by mouth once daily 4)  Amaryl 4 Mg Tabs (Glimepiride) .... 2 tabs by mouth once daily 5)  Ecotrin Low Strength 81 Mg Tbec (Aspirin) .Marland Kitchen.. 1 by mouth qd 6)  Januvia 100 Mg Tabs (Sitagliptin phosphate) .Marland Kitchen.. 1 by mouth once daily 7)  Lisinopril 5 Mg Tabs (Lisinopril) .Marland Kitchen.. 1 by mouth once daily 8)  Onetouch Ultra Test Strp (Glucose blood) .... Use asd 1 once daily 9)  Lancets Misc (Lancets) .... Use asd 1 once daily 10)  Diclofenac Sodium 75 Mg Tbec (Diclofenac sodium) .Marland Kitchen.. 1po two times a day as needed pain  Patient Instructions: 1)  please consider applying for the patient assist programs for the actos and januvia 2)  please call if you would consider being referred for counseling 3)  Continue all previous medications as before this visit 4)  Please schedule a follow-up appointment in 6 months for CPX with labs and: 5)  HbgA1C prior to visit, ICD-9: 250.02 6)  Urine Microalbumin  prior to visit, ICD-9: Prescriptions: DICLOFENAC SODIUM 75 MG TBEC (DICLOFENAC SODIUM) 1po two times a day as needed pain  #60 x 5   Entered and Authorized by:   Corwin Levins MD   Signed by:   Corwin Levins MD on 02/23/2010   Method used:   Print then Give to Patient   RxID:   [id number] ACTOS 45 MG  TABS (PIOGLITAZONE HCL) 1 by mouth once daily  #90 x 3   Entered and Authorized by:   Corwin Levins MD   Signed by:   Corwin Levins MD on 02/23/2010   Method used:   Print then Give to Patient   RxID:   [id number] JANUVIA 100 MG  TABS (SITAGLIPTIN PHOSPHATE) 1 by mouth once daily  #90 x 3   Entered and Authorized by:   Corwin Levins MD   Signed by:   Corwin Levins MD on 02/23/2010   Method used:   Print then Give to Patient   RxID:   [id number]    Orders Added: 1)  Est. Patient Level IV [34742]

## 2010-03-02 ENCOUNTER — Encounter: Payer: Self-pay | Admitting: Internal Medicine

## 2010-03-16 NOTE — Medication Information (Signed)
Summary: [facility] / [employer] Patient Assist Program  Januvia / [employer] Patient Assist Program   Imported By: Lennie Odor 03/07/2010 15:30:53  _____________________________________________________________________  External Attachment:    Type:   Image     Comment:   External Document

## 2010-03-16 NOTE — Medication Information (Signed)
Summary: Actos / [employer] Patient Assist Program  Actos / [employer] Patient Assist Program   Imported By: Lennie Odor 03/07/2010 15:32:51  _____________________________________________________________________  External Attachment:    Type:   Image     Comment:   External Document

## 2010-04-19 ENCOUNTER — Other Ambulatory Visit: Payer: Self-pay | Admitting: Internal Medicine

## 2010-06-25 ENCOUNTER — Other Ambulatory Visit: Payer: Self-pay | Admitting: Internal Medicine

## 2010-07-04 ENCOUNTER — Other Ambulatory Visit: Payer: Self-pay

## 2010-07-04 MED ORDER — ONETOUCH DELICA LANCETS MISC: Status: DC

## 2010-07-04 MED ORDER — GLUCOSE BLOOD VI STRP: ORAL_STRIP | Status: DC

## 2010-07-05 ENCOUNTER — Other Ambulatory Visit: Payer: Self-pay | Admitting: Internal Medicine

## 2010-07-31 ENCOUNTER — Other Ambulatory Visit: Payer: Self-pay | Admitting: Internal Medicine

## 2010-08-10 ENCOUNTER — Other Ambulatory Visit: Payer: Self-pay | Admitting: Internal Medicine

## 2010-08-10 ENCOUNTER — Other Ambulatory Visit: Payer: BC Managed Care – PPO

## 2010-08-10 DIAGNOSIS — Z1289 Encounter for screening for malignant neoplasm of other sites: Secondary | ICD-10-CM

## 2010-08-10 DIAGNOSIS — Z0000 Encounter for general adult medical examination without abnormal findings: Secondary | ICD-10-CM

## 2010-08-14 ENCOUNTER — Other Ambulatory Visit: Payer: Self-pay | Admitting: Internal Medicine

## 2010-08-15 ENCOUNTER — Ambulatory Visit: Payer: BC Managed Care – PPO | Admitting: Internal Medicine

## 2010-10-03 ENCOUNTER — Ambulatory Visit (INDEPENDENT_AMBULATORY_CARE_PROVIDER_SITE_OTHER): Payer: BC Managed Care – PPO

## 2010-10-03 DIAGNOSIS — Z23 Encounter for immunization: Secondary | ICD-10-CM

## 2010-10-04 ENCOUNTER — Ambulatory Visit: Payer: BC Managed Care – PPO

## 2010-11-20 ENCOUNTER — Telehealth: Payer: Self-pay

## 2010-11-20 MED ORDER — SILDENAFIL CITRATE 100 MG PO TABS: 100.0000 mg | ORAL_TABLET | ORAL | Status: DC | PRN

## 2010-11-20 NOTE — Telephone Encounter (Signed)
Pt called requesting Rx for Viagra to treat recent ED sxs related to work and life stressors per pt. Please advise.

## 2010-11-20 NOTE — Telephone Encounter (Signed)
Done per emr 

## 2010-11-21 ENCOUNTER — Telehealth: Payer: Self-pay

## 2010-11-21 MED ORDER — SILDENAFIL CITRATE 100 MG PO TABS: 100.0000 mg | ORAL_TABLET | ORAL | Status: DC | PRN

## 2010-11-21 NOTE — Telephone Encounter (Signed)
Called the patient left message prescription requested has been sent in. 

## 2010-11-21 NOTE — Telephone Encounter (Signed)
Called the patient informed. 

## 2010-11-21 NOTE — Telephone Encounter (Signed)
rx changed

## 2010-11-21 NOTE — Telephone Encounter (Signed)
Iniated PA for Jake Chen, [employer] did deny the Quantity of #10.  The approved amount is #6 for a 30 day supply

## 2011-01-26 ENCOUNTER — Encounter: Payer: Self-pay | Admitting: Internal Medicine

## 2011-01-26 ENCOUNTER — Ambulatory Visit: Payer: BC Managed Care – PPO | Admitting: Internal Medicine

## 2011-01-26 ENCOUNTER — Ambulatory Visit (INDEPENDENT_AMBULATORY_CARE_PROVIDER_SITE_OTHER): Payer: BC Managed Care – PPO | Admitting: Internal Medicine

## 2011-01-26 VITALS — BP 122/80 | HR 81 | Temp 97.2°F | Ht 71.0 in | Wt 155.0 lb

## 2011-01-26 DIAGNOSIS — F3289 Other specified depressive episodes: Secondary | ICD-10-CM

## 2011-01-26 DIAGNOSIS — Z0000 Encounter for general adult medical examination without abnormal findings: Secondary | ICD-10-CM

## 2011-01-26 DIAGNOSIS — N529 Male erectile dysfunction, unspecified: Secondary | ICD-10-CM

## 2011-01-26 DIAGNOSIS — E119 Type 2 diabetes mellitus without complications: Secondary | ICD-10-CM

## 2011-01-26 DIAGNOSIS — F329 Major depressive disorder, single episode, unspecified: Secondary | ICD-10-CM | POA: Insufficient documentation

## 2011-01-26 DIAGNOSIS — F32A Depression, unspecified: Secondary | ICD-10-CM | POA: Insufficient documentation

## 2011-01-26 DIAGNOSIS — I10 Essential (primary) hypertension: Secondary | ICD-10-CM

## 2011-01-26 HISTORY — DX: Male erectile dysfunction, unspecified: N52.9

## 2011-01-26 MED ORDER — LOVASTATIN 40 MG PO TABS: 40.0000 mg | ORAL_TABLET | Freq: Every day | ORAL | Status: DC

## 2011-01-26 MED ORDER — SITAGLIPTIN PHOSPHATE 100 MG PO TABS: 100.0000 mg | ORAL_TABLET | Freq: Every day | ORAL | Status: DC

## 2011-01-26 MED ORDER — METFORMIN HCL ER 500 MG PO TB24: 1000.0000 mg | ORAL_TABLET | Freq: Every day | ORAL | Status: DC

## 2011-01-26 MED ORDER — GLIMEPIRIDE 4 MG PO TABS: 4.0000 mg | ORAL_TABLET | Freq: Every day | ORAL | Status: DC

## 2011-01-26 MED ORDER — VARDENAFIL HCL 20 MG PO TABS: 20.0000 mg | ORAL_TABLET | ORAL | Status: AC | PRN

## 2011-01-26 MED ORDER — PIOGLITAZONE HCL 45 MG PO TABS: 45.0000 mg | ORAL_TABLET | Freq: Every day | ORAL | Status: DC

## 2011-01-26 MED ORDER — LISINOPRIL 5 MG PO TABS: 5.0000 mg | ORAL_TABLET | Freq: Every day | ORAL | Status: DC

## 2011-01-26 NOTE — Assessment & Plan Note (Signed)
Ok for levitra prn, as less expensive than viagra and cialis

## 2011-01-26 NOTE — Progress Notes (Signed)
  Subjective:    Patient ID: Jake Chen, male    DOB: April 16, 1950, 61 y.o.   MRN: [id number]  HPI  Here after lost job in may 2012, wife left in June 2012, now separated, divorce pending, trying to sell house, kids are off on their own, dog died earlier this yr; not checked cbg's in about almost a yr, not taking medicaitons at all, wife is throwing him out of the house, utilities getting turned off later this month, cant pay bills.  Still with health insurance under [employer].  Lost about 20 lbs with worse diet on top of that, has urinary freq with poor diet and no DM meds.  Denies worsening depressive symptoms, suicidal ideation, or panic, though has ongoing anxiety, declines med tx at this time.  Pt denies chest pain, increased sob or doe, wheezing, orthopnea, PND, increased LE swelling, palpitations, dizziness or syncope.  Pt denies new neurological symptoms such as new headache, or facial or extremity weakness or numbness   Pt denies low sugar symptoms such as weakness or confusion improved with po intake. Past Medical History  Diagnosis Date  . Depression 01/26/2011  . Erectile dysfunction 01/26/2011   No past surgical history on file.  does not have a smoking history on file. He does not have any smokeless tobacco history on file. His alcohol and drug histories not on file. family history is not on file. No Known Allergies Current Outpatient Prescriptions on File Prior to Visit  Medication Sig Dispense Refill  . glucose blood (ONE TOUCH ULTRA TEST) test strip Use as directed once daily  100 each  6  . ONETOUCH DELICA LANCETS MISC Use as directed once daily  100 each  6   Review of Systems Review of Systems  Constitutional: Negative for diaphoresis and unexpected weight change.  HENT: Negative for drooling and tinnitus.   Eyes: Negative for photophobia and visual disturbance.  Respiratory: Negative for choking and stridor.   Gastrointestinal: Negative for vomiting and blood in stool.    Genitourinary: Negative for hematuria and decreased urine volume.  Objective:   Physical Exam BP 122/80  Pulse 81  Temp(Src) 97.2 F (36.2 C) (Oral)  Ht 5\' 11"  (1.803 m)  Wt 155 lb (70.308 kg)  BMI 21.62 kg/m2  SpO2 97% Has lost considerable wt - thinner Physical Exam  VS noted, not ill appearing Constitutional: Pt appears well-developed and well-nourished.  HENT: Head: Normocephalic.  Right Ear: External ear normal.  Left Ear: External ear normal.  Eyes: Conjunctivae and EOM are normal. Pupils are equal, round, and reactive to light.  Neck: Normal range of motion. Neck supple.  Cardiovascular: Normal rate and regular rhythm.   Pulmonary/Chest: Effort normal and breath sounds normal.  Abd:  Soft, NT, non-distended, + BS Neurological: Pt is alert. No cranial nerve deficit.  Skin: Skin is warm. No erythema.  Psychiatric: Pt behavior is normal. Thought content normal. depressed affect, 1-2+ nervous    Assessment & Plan:

## 2011-01-26 NOTE — Assessment & Plan Note (Signed)
stable overall by hx and exam, most recent data reviewed with pt, and pt to continue medical treatment as before  BP Readings from Last 3 Encounters:  01/26/11 122/80  02/23/10 112/68  08/22/09 112/68

## 2011-01-26 NOTE — Assessment & Plan Note (Signed)
Uncontrolled, with wt loss and polyuria the most prominent features, declines labs, but accepts januvia 100 mg samples x 1 mo as well as all new rx, with the metformin and amaryl free at [facility]; also given discount card information for help with affording the [place]. To cont all med with f/u at 3 months

## 2011-01-26 NOTE — Assessment & Plan Note (Addendum)
Verified nonsuidical, I think should be on SSRI but unfort he declines, as well as referral for counseling, even though he has COBRA insurance to last the rest of the yr; he plans to move to [place] later this month where he also owns a home he wont lose in the separation/divorce, though wants to return here for f/u

## 2011-01-26 NOTE — Patient Instructions (Signed)
Please re-start your medications, including the new levitra Please call if you would like to start the citalopram 10 mg per day Please return in 3 mo with Lab testing done 3-5 days before

## 2011-02-16 ENCOUNTER — Other Ambulatory Visit (INDEPENDENT_AMBULATORY_CARE_PROVIDER_SITE_OTHER): Payer: BC Managed Care – PPO

## 2011-02-16 DIAGNOSIS — E119 Type 2 diabetes mellitus without complications: Secondary | ICD-10-CM

## 2011-02-16 LAB — LIPID PANEL
LDL Cholesterol: 69 mg/dL (ref 0–99)
Total CHOL/HDL Ratio: 2
Triglycerides: 55 mg/dL (ref 0.0–149.0)

## 2011-02-16 LAB — BASIC METABOLIC PANEL
CO2: 31 mEq/L (ref 19–32)
Calcium: 8.9 mg/dL (ref 8.4–10.5)
Chloride: 102 mEq/L (ref 96–112)
Creatinine, Ser: 0.9 mg/dL (ref 0.4–1.5)
Sodium: 138 mEq/L (ref 135–145)

## 2011-02-19 ENCOUNTER — Telehealth: Payer: Self-pay

## 2011-02-19 NOTE — Telephone Encounter (Signed)
Sent copy a1c results (02/16/2011) to patients eye MD. Blair Promise at [facility] per patient request.

## 2011-03-09 ENCOUNTER — Telehealth: Payer: Self-pay

## 2011-03-09 NOTE — Telephone Encounter (Signed)
PA approved for Januvia for one year through 03/07/2012.  Called [phone] opt. 3. Received approval letter from [employer], [place] code 75. Sent PA approval letter to scan.

## 2011-04-24 ENCOUNTER — Ambulatory Visit (INDEPENDENT_AMBULATORY_CARE_PROVIDER_SITE_OTHER): Payer: BC Managed Care – PPO | Admitting: Internal Medicine

## 2011-04-24 ENCOUNTER — Encounter: Payer: Self-pay | Admitting: Internal Medicine

## 2011-04-24 VITALS — BP 110/70 | HR 74 | Temp 98.6°F | Ht 71.0 in | Wt 157.2 lb

## 2011-04-24 DIAGNOSIS — E785 Hyperlipidemia, unspecified: Secondary | ICD-10-CM

## 2011-04-24 DIAGNOSIS — E119 Type 2 diabetes mellitus without complications: Secondary | ICD-10-CM

## 2011-04-24 DIAGNOSIS — I10 Essential (primary) hypertension: Secondary | ICD-10-CM

## 2011-04-24 DIAGNOSIS — F411 Generalized anxiety disorder: Secondary | ICD-10-CM

## 2011-04-24 NOTE — Progress Notes (Addendum)
Subjective:    Patient ID: Jake Chen, male    DOB: [birth year], 61 y.o.   MRN: [id number]  HPI  Here to f/u; overall doing ok,  Pt back on prior meds, walking 1-1.5 miles per day, cbg's in low 100's, hard to gain wt. Pt denies chest pain, increased sob or doe, wheezing, orthopnea, PND, increased LE swelling, palpitations, dizziness or syncope.  Pt denies new neurological symptoms such as new headache, or facial or extremity weakness or numbness   Pt denies polydipsia, polyuria, or low sugar symptoms such as weakness or confusion improved with po intake.  Pt states overall good compliance with meds, trying to follow lower cholesterol, diabetic diet, wt overall stable but little exercise however.  Overall less depressed than previously, but still quite stressed, though has new girlfriend.  Still with signficant loss of wt, cant seem to gain it back. Denies hyper or hypo thyroid symptoms such as voice, skin or hair change. Past Medical History  Diagnosis Date  . Depression 01/26/2011  . Erectile dysfunction 01/26/2011   No past surgical history on file.  reports that he has never smoked. He has never used smokeless tobacco. His alcohol and drug histories not on file. family history is not on file. No Known Allergies Current Outpatient Prescriptions on File Prior to Visit  Medication Sig Dispense Refill  . glimepiride (AMARYL) 4 MG tablet Take 1 tablet (4 mg total) by mouth daily before breakfast.  90 tablet  3  . glucose blood (ONE TOUCH ULTRA TEST) test strip Use as directed once daily  100 each  6  . lisinopril (PRINIVIL,ZESTRIL) 5 MG tablet Take 1 tablet (5 mg total) by mouth daily.  90 tablet  3  . lovastatin (MEVACOR) 40 MG tablet Take 1 tablet (40 mg total) by mouth at bedtime.  90 tablet  3  . metFORMIN (GLUCOPHAGE-XR) 500 MG 24 hr tablet Take 2 tablets (1,000 mg total) by mouth daily with breakfast.  180 tablet  3  . ONETOUCH DELICA LANCETS MISC Use as directed once daily  100 each  6    . pioglitazone (ACTOS) 45 MG tablet Take 1 tablet (45 mg total) by mouth daily.  90 tablet  3  . sitaGLIPtin (JANUVIA) 100 MG tablet Take 1 tablet (100 mg total) by mouth daily.  90 tablet  3   Review of Systems Review of Systems  Constitutional: Negative for diaphoresis  Eyes: Negative for photophobia and visual disturbance.  Respiratory: Negative for choking and stridor.   Gastrointestinal: Negative for vomiting and blood in stool.  Genitourinary: Negative for hematuria and decreased urine volume.  Musculoskeletal: Negative for gait problem.  Skin: Negative for color change and wound.  Neurological: Negative for tremors and numbness.  Psychiatric/Behavioral: Negative for decreased concentration. The patient is not hyperactive.      Objective:   Physical Exam BP 110/70  Pulse 74  Temp(Src) 98.6 F (37 C) (Oral)  Ht 5\' 11"  (1.803 m)  Wt 157 lb 4 oz (71.328 kg)  BMI 21.93 kg/m2  SpO2 98% Physical Exam  VS noted, not ill appearing Constitutional: Pt appears well-developed and well-nourished.  HENT: Head: Normocephalic.  Right Ear: External ear normal.  Left Ear: External ear normal.  Eyes: Conjunctivae and EOM are normal. Pupils are equal, round, and reactive to light.  Neck: Normal range of motion. Neck supple.  Cardiovascular: Normal rate and regular rhythm.   Pulmonary/Chest: Effort normal and breath sounds normal.  Neurological: Pt is alert. Motor -  intact Skin: Skin is warm. No erythema.  Psychiatric: Pt behavior is normal. Thought content normal.1-2+ nervous     Assessment & Plan:

## 2011-04-24 NOTE — Patient Instructions (Signed)
Continue all other medications as before Continue to monitor your blood sugars as you do Please call if you would like to start the 10 mg per day of Paxil Please return in 6 mo with Lab testing done 3-5 days before

## 2011-04-29 ENCOUNTER — Encounter: Payer: Self-pay | Admitting: Internal Medicine

## 2011-04-29 NOTE — Assessment & Plan Note (Signed)
Still significant - I suggested to start paxil which has been assoc with wt gain, pt declines, as well as declines counseling

## 2011-04-29 NOTE — Assessment & Plan Note (Signed)
stable overall by hx and exam, most recent data reviewed with pt, and pt to continue medical treatment as before  Lab Results  Component Value Date   LDLCALC 69 02/16/2011

## 2011-04-29 NOTE — Progress Notes (Deleted)
  Subjective:    Patient ID: Jake Chen, male    DOB: Aug 22, 1950, 61 y.o.   MRN: [id number]  HPI    Review of Systems     Objective:   Physical Exam        Assessment & Plan:

## 2011-04-29 NOTE — Assessment & Plan Note (Signed)
stable overall by hx and exam, most recent data reviewed with pt, and pt to continue medical treatment as before, now back on better diet and med, for f/u a1c Lab Results  Component Value Date   HGBA1C 13.7* 02/16/2011

## 2011-04-29 NOTE — Assessment & Plan Note (Signed)
stable overall by hx and exam, most recent data reviewed with pt, and pt to continue medical treatment as before  BP Readings from Last 3 Encounters:  04/24/11 110/70  01/26/11 122/80  02/23/10 112/68

## 2011-05-08 ENCOUNTER — Other Ambulatory Visit: Payer: Self-pay

## 2011-05-08 MED ORDER — PIOGLITAZONE HCL 45 MG PO TABS: 45.0000 mg | ORAL_TABLET | Freq: Every day | ORAL | Status: DC

## 2011-05-08 MED ORDER — LOVASTATIN 40 MG PO TABS: 40.0000 mg | ORAL_TABLET | Freq: Every day | ORAL | Status: DC

## 2011-05-08 MED ORDER — LISINOPRIL 5 MG PO TABS: 5.0000 mg | ORAL_TABLET | Freq: Every day | ORAL | Status: DC

## 2011-05-08 MED ORDER — METFORMIN HCL ER 500 MG PO TB24: 1000.0000 mg | ORAL_TABLET | Freq: Every day | ORAL | Status: DC

## 2011-05-08 MED ORDER — GLIMEPIRIDE 4 MG PO TABS: 4.0000 mg | ORAL_TABLET | Freq: Every day | ORAL | Status: DC

## 2011-05-09 ENCOUNTER — Other Ambulatory Visit: Payer: Self-pay

## 2011-05-09 MED ORDER — SITAGLIPTIN PHOSPHATE 100 MG PO TABS: 100.0000 mg | ORAL_TABLET | Freq: Every day | ORAL | Status: DC

## 2011-05-25 ENCOUNTER — Telehealth: Payer: Self-pay

## 2011-05-25 MED ORDER — GLIMEPIRIDE 4 MG PO TABS: 4.0000 mg | ORAL_TABLET | Freq: Two times a day (BID) | ORAL | Status: DC

## 2011-05-25 NOTE — Telephone Encounter (Signed)
[facility]

## 2011-05-25 NOTE — Telephone Encounter (Signed)
Patients prescription for Glimepiride 4 mg was filled #90 taking once daily.  The patient states he takes BID please advise if patient is correct and send to [facility]

## 2011-06-21 ENCOUNTER — Other Ambulatory Visit: Payer: Self-pay

## 2011-06-21 MED ORDER — GLUCOSE BLOOD VI STRP: ORAL_STRIP | Status: DC

## 2011-06-21 MED ORDER — ONETOUCH DELICA LANCETS MISC: Status: DC

## 2011-07-19 ENCOUNTER — Encounter: Payer: Self-pay | Admitting: Internal Medicine

## 2011-07-19 ENCOUNTER — Ambulatory Visit (INDEPENDENT_AMBULATORY_CARE_PROVIDER_SITE_OTHER): Payer: BC Managed Care – PPO | Admitting: Internal Medicine

## 2011-07-19 VITALS — BP 132/78 | HR 95 | Temp 97.2°F | Ht 71.0 in | Wt 154.5 lb

## 2011-07-19 DIAGNOSIS — I10 Essential (primary) hypertension: Secondary | ICD-10-CM

## 2011-07-19 DIAGNOSIS — F329 Major depressive disorder, single episode, unspecified: Secondary | ICD-10-CM

## 2011-07-19 DIAGNOSIS — E119 Type 2 diabetes mellitus without complications: Secondary | ICD-10-CM

## 2011-07-19 DIAGNOSIS — F341 Dysthymic disorder: Secondary | ICD-10-CM

## 2011-07-19 MED ORDER — PAROXETINE HCL 20 MG PO TABS: 20.0000 mg | ORAL_TABLET | ORAL | Status: DC

## 2011-07-19 NOTE — Patient Instructions (Addendum)
Take all new medications as prescribed  - the generic paxil at 20 mg per day (but start with HALF pill for 1 week, then full pill after that) Continue all other medications as before Please return in 6 wks with Lab testing done 3-5 days before

## 2011-07-21 ENCOUNTER — Encounter: Payer: Self-pay | Admitting: Internal Medicine

## 2011-07-21 NOTE — Progress Notes (Signed)
Subjective:    Patient ID: Jake Chen, male    DOB: [birth year], 61 y.o.   MRN: [id number]  HPI  Here to f/u; overall doing ok, but has ongoing depression symptoms and concerned about not being able to gain wt, having occas hot sweats, not losing wt, but no suicidal ideation, or panic, though has ongoing anxiety as well.  Pt denies chest pain, increased sob or doe, wheezing, orthopnea, PND, increased LE swelling, palpitations, dizziness or syncope.   Pt denies polydipsia, polyuria, or low sugar symptoms such as weakness or confusion improved with po intake.  Pt states overall good compliance with meds, trying to follow lower cholesterol, diabetic diet, wt overall stable but little exercise however.     Pt denies new neurological symptoms such as new headache, or facial or extremity weakness or numbness.  Pt denies fever, wt loss, loss of appetite, or other constitutional symptoms. Past Medical History  Diagnosis Date  . Depression 01/26/2011  . Erectile dysfunction 01/26/2011   No past surgical history on file.  reports that he has never smoked. He has never used smokeless tobacco. His alcohol and drug histories not on file. family history is not on file. No Known Allergies Current Outpatient Prescriptions on File Prior to Visit  Medication Sig Dispense Refill  . glimepiride (AMARYL) 4 MG tablet Take 1 tablet (4 mg total) by mouth 2 (two) times daily.  180 tablet  3  . glucose blood (ONE TOUCH ULTRA TEST) test strip Use as directed once daily  100 each  1  . lisinopril (PRINIVIL,ZESTRIL) 5 MG tablet Take 1 tablet (5 mg total) by mouth daily.  90 tablet  3  . lovastatin (MEVACOR) 40 MG tablet Take 1 tablet (40 mg total) by mouth at bedtime.  90 tablet  3  . metFORMIN (GLUCOPHAGE-XR) 500 MG 24 hr tablet Take 2 tablets (1,000 mg total) by mouth daily with breakfast.  180 tablet  3  . ONETOUCH DELICA LANCETS MISC Use as directed once daily  100 each  1  . pioglitazone (ACTOS) 45 MG tablet Take 1  tablet (45 mg total) by mouth daily.  90 tablet  3  . sitaGLIPtin (JANUVIA) 100 MG tablet Take 1 tablet (100 mg total) by mouth daily.  90 tablet  3  . PARoxetine (PAXIL) 20 MG tablet Take 1 tablet (20 mg total) by mouth every morning.  90 tablet  3   Review of Systems Review of Systems  Constitutional: Negative for diaphoresis and unexpected weight change.  Eyes: Negative for photophobia and visual disturbance.  Respiratory: Negative for choking and stridor.   Gastrointestinal: Negative for vomiting and blood in stool.  Genitourinary: Negative for hematuria and decreased urine volume.  Musculoskeletal: Negative for gait problem.  Skin: Negative for color change and wound.  Neurological: Negative for tremors and numbness.      Objective:   Physical Exam BP 132/78  Pulse 95  Temp 97.2 F (36.2 C) (Oral)  Ht 5\' 11"  (1.803 m)  Wt 154 lb 8 oz (70.081 kg)  BMI 21.55 kg/m2  SpO2 97% Physical Exam  VS noted Constitutional: Pt appears well-developed and well-nourished.  HENT: Head: Normocephalic.  Right Ear: External ear normal.  Left Ear: External ear normal.  Eyes: Conjunctivae and EOM are normal. Pupils are equal, round, and reactive to light.  Neck: Normal range of motion. Neck supple.  Cardiovascular: Normal rate and regular rhythm.   Pulmonary/Chest: Effort normal and breath sounds normal.  Abd:  Soft,  NT, +BS Neurological: Pt is alert. Gait intact.  Skin: Skin is warm. No erythema. No rash Psychiatric: Pt behavior is normal. Thought content normal. +depressed affect, 1+ nervous    Assessment & Plan:

## 2011-07-21 NOTE — Assessment & Plan Note (Signed)
stable overall by hx and exam, most recent data reviewed with pt, and pt to continue medical treatment as before Lab Results  Component Value Date   HGBA1C 13.7* 02/16/2011   Declines f/u lab today, warned pt about increased wt with worsening dm control, to follow diet closely

## 2011-07-21 NOTE — Assessment & Plan Note (Signed)
Ok for tx -- paxil 20 qd, to help with symptoms as well as should help with inability to gain wt

## 2011-07-21 NOTE — Assessment & Plan Note (Signed)
stable overall by hx and exam, most recent data reviewed with pt, and pt to continue medical treatment as before BP Readings from Last 3 Encounters:  07/19/11 132/78  04/24/11 110/70  01/26/11 122/80

## 2011-09-05 ENCOUNTER — Telehealth: Payer: Self-pay | Admitting: Internal Medicine

## 2011-09-05 NOTE — Telephone Encounter (Signed)
Called the patient at [phone] left message to call back

## 2011-09-05 NOTE — Telephone Encounter (Signed)
Received phone call per Dr Karlyn Agee -  Pt seen ostensibly for hearing loss acute, had been to gun range the day before; had emotionality while there/tearful, so asked per Dr Jenne Pane if he was considering suicide - did not say "yes" verbally but indicated this was so nonverbally, with the implication from Dr Jenne Pane to me was he may have a plan involving the gun.  No indication of homicidality  Robin to contact pt - I would like to start anti-depressant now such as lexapro 10 qd, but would need to know where to send, if he assents  Also, I would strongly urge him TONIGHT to go to local (pt lives in [place]) ER or UC or behavioral health facility for evaluation for depression and possible suicidal ideation ; other options would be to present to [facility] ER or UC tonight, or [facility] intake during work hours tomorrow to be evaluated for same, or at the least to me tomorrow

## 2011-09-06 NOTE — Telephone Encounter (Signed)
Noted, will follow for now

## 2011-09-06 NOTE — Telephone Encounter (Signed)
Called the patient informed of All instructions per MD.  Memory Argue to send in rx per MD instructions as well.  The patient stated he does not want any medication at this time and is doing ok.  He is meeting with his attorney this am and stated hopefully some things will be resolved in the next month.  The patient stated he is doing ok and did agree completely to seek help if he needs to do so.  He did appreciate the phone call and concern.

## 2011-10-10 ENCOUNTER — Ambulatory Visit (INDEPENDENT_AMBULATORY_CARE_PROVIDER_SITE_OTHER): Payer: BC Managed Care – PPO

## 2011-10-10 DIAGNOSIS — Z23 Encounter for immunization: Secondary | ICD-10-CM

## 2011-10-30 ENCOUNTER — Ambulatory Visit (INDEPENDENT_AMBULATORY_CARE_PROVIDER_SITE_OTHER): Payer: BC Managed Care – PPO | Admitting: Internal Medicine

## 2011-10-30 ENCOUNTER — Encounter: Payer: Self-pay | Admitting: Internal Medicine

## 2011-10-30 VITALS — BP 118/82 | HR 77 | Temp 97.0°F | Ht 71.0 in | Wt 165.2 lb

## 2011-10-30 DIAGNOSIS — E119 Type 2 diabetes mellitus without complications: Secondary | ICD-10-CM

## 2011-10-30 DIAGNOSIS — Z0000 Encounter for general adult medical examination without abnormal findings: Secondary | ICD-10-CM

## 2011-10-30 NOTE — Patient Instructions (Addendum)
Continue all other medications as before Please call if you change your mind about lab work We'll try to work on the colonoscopy next yr per your request Please have the pharmacy call with any refills you may need. Please return in 6 months, or sooner if needed

## 2011-10-30 NOTE — Progress Notes (Signed)
Subjective:    Patient ID: Jake Chen, male    DOB: June 11, 1950, 61 y.o.   MRN: [id number]  HPI  Here for wellness and f/u;  Overall doing ok;  Pt denies CP, worsening SOB, DOE, wheezing, orthopnea, PND, worsening LE edema, palpitations, dizziness or syncope.  Pt denies neurological change such as new Headache, facial or extremity weakness.  Pt denies polydipsia, polyuria, or low sugar symptoms. Pt states overall good compliance with treatment and medications, good tolerability, and trying to follow lower cholesterol diet.  Pt denies worsening depressive symptoms, suicidal ideation or panic. No fever, wt loss, night sweats, loss of appetite, or other constitutional symptoms.  Pt states good ability with ADL's, low fall risk, home safety reviewed and adequate, no significant changes in hearing or vision, and occasionally active with exercise.  Still not working, on fixed income on soc sec now;  Property settlement still pending from his divorce, has other bills, dog died, lives isolated, has large home and SUV he cant sell.  Decided not to take the paxil.  Has been able to gain wt., is walking 2 miles per day.  Denies worsening depressive symptoms, suicidal ideation, or panic, so essentially still on hold given his legal problems and bills.  Overall good compliance with treatment, and good medicine tolerability, just not the paxil.   Past Medical History  Diagnosis Date  . Depression 01/26/2011  . Erectile dysfunction 01/26/2011   No past surgical history on file.  reports that he has never smoked. He has never used smokeless tobacco. His alcohol and drug histories not on file. family history is not on file. No Known Allergies Current Outpatient Prescriptions on File Prior to Visit  Medication Sig Dispense Refill  . glimepiride (AMARYL) 4 MG tablet Take 1 tablet (4 mg total) by mouth 2 (two) times daily.  180 tablet  3  . glucose blood (ONE TOUCH ULTRA TEST) test strip Use as directed once daily   100 each  1  . lisinopril (PRINIVIL,ZESTRIL) 5 MG tablet Take 1 tablet (5 mg total) by mouth daily.  90 tablet  3  . lovastatin (MEVACOR) 40 MG tablet Take 1 tablet (40 mg total) by mouth at bedtime.  90 tablet  3  . metFORMIN (GLUCOPHAGE-XR) 500 MG 24 hr tablet Take 2 tablets (1,000 mg total) by mouth daily with breakfast.  180 tablet  3  . ONETOUCH DELICA LANCETS MISC Use as directed once daily  100 each  1  . pioglitazone (ACTOS) 45 MG tablet Take 1 tablet (45 mg total) by mouth daily.  90 tablet  3  . sitaGLIPtin (JANUVIA) 100 MG tablet Take 1 tablet (100 mg total) by mouth daily.  90 tablet  3   Review of Systems Review of Systems  Constitutional: Negative for diaphoresis, activity change, appetite change and unexpected weight change.  HENT: Negative for hearing loss, ear pain, facial swelling, mouth sores and neck stiffness.   Eyes: Negative for pain, redness and visual disturbance.  Respiratory: Negative for shortness of breath and wheezing.   Cardiovascular: Negative for chest pain and palpitations.  Gastrointestinal: Negative for diarrhea, blood in stool, abdominal distention and rectal pain.  Genitourinary: Negative for hematuria, flank pain and decreased urine volume.  Musculoskeletal: Negative for myalgias and joint swelling.  Skin: Negative for color change and wound.  Neurological: Negative for syncope and numbness.  Hematological: Negative for adenopathy.  Psychiatric/Behavioral: Negative for hallucinations, self-injury, decreased concentration and agitation.      Objective:  Physical Exam BP 118/82  Pulse 77  Temp 97 F (36.1 C) (Oral)  Ht 5\' 11"  (1.803 m)  Wt 165 lb 4 oz (74.957 kg)  BMI 23.05 kg/m2  SpO2 98% Physical Exam  VS noted Constitutional: Pt is oriented to person, place, and time. Appears well-developed and well-nourished./less thin than previous  HENT:  Head: Normocephalic and atraumatic.  Right Ear: External ear normal.  Left Ear: External ear  normal.  Nose: Nose normal.  Mouth/Throat: Oropharynx is clear and moist.  Eyes: Conjunctivae and EOM are normal. Pupils are equal, round, and reactive to light.  Neck: Normal range of motion. Neck supple. No JVD present. No tracheal deviation present.  Cardiovascular: Normal rate, regular rhythm, normal heart sounds and intact distal pulses.   Pulmonary/Chest: Effort normal and breath sounds normal.  Abdominal: Soft. Bowel sounds are normal. There is no tenderness.  Musculoskeletal: Normal range of motion. Exhibits no edema.  Lymphadenopathy:  Has no cervical adenopathy.  Neurological: Pt is alert and oriented to person, place, and time. Pt has normal reflexes. No cranial nerve deficit.  Skin: Skin is warm and dry. No rash noted.  Psychiatric:  Has depressed affect, 1+ nervous.    Assessment & Plan:

## 2011-11-04 ENCOUNTER — Encounter: Payer: Self-pay | Admitting: Internal Medicine

## 2011-11-04 NOTE — Assessment & Plan Note (Signed)

## 2011-11-04 NOTE — Assessment & Plan Note (Signed)
Suspect uncontrolled, declines labs or change in meds today

## 2011-11-07 ENCOUNTER — Other Ambulatory Visit: Payer: Self-pay | Admitting: Internal Medicine

## 2012-01-06 ENCOUNTER — Other Ambulatory Visit: Payer: Self-pay | Admitting: Internal Medicine

## 2012-05-09 ENCOUNTER — Ambulatory Visit: Payer: BC Managed Care – PPO | Admitting: Internal Medicine

## 2012-05-15 ENCOUNTER — Telehealth: Payer: Self-pay | Admitting: Internal Medicine

## 2012-05-15 NOTE — Telephone Encounter (Signed)
Received 2 pages from [employer], sent to Dr. Jonny Ruiz. 05/15/12/ss

## 2012-05-27 ENCOUNTER — Encounter: Payer: Self-pay | Admitting: Internal Medicine

## 2012-05-30 ENCOUNTER — Ambulatory Visit: Payer: BC Managed Care – PPO | Admitting: Internal Medicine

## 2012-09-22 ENCOUNTER — Telehealth: Payer: Self-pay | Admitting: Internal Medicine

## 2012-09-22 NOTE — Telephone Encounter (Signed)
Pt has an appointment with Dr. Jonny Ruiz 09/23/12.

## 2012-09-22 NOTE — Telephone Encounter (Signed)
Thank you Rene Kocher. Dr. Yetta Barre, Please advise.

## 2012-09-22 NOTE — Telephone Encounter (Signed)
Pt called request to be seen today by one of the doctor here for wight lost and memory lost. Inform pt that Dr. Jonny Ruiz is not here today, but pt stated that he live 2 hours away, its hard for him to come back and he only in town for today. Not sure if you feel comfortable to see Dr. Jonny Ruiz pt for this problem. Please advise.

## 2012-09-22 NOTE — Telephone Encounter (Signed)
I don't, maybe one of the other providers could see him.

## 2012-09-23 ENCOUNTER — Ambulatory Visit (INDEPENDENT_AMBULATORY_CARE_PROVIDER_SITE_OTHER): Payer: PRIVATE HEALTH INSURANCE | Admitting: Internal Medicine

## 2012-09-23 ENCOUNTER — Encounter: Payer: Self-pay | Admitting: Internal Medicine

## 2012-09-23 ENCOUNTER — Other Ambulatory Visit (INDEPENDENT_AMBULATORY_CARE_PROVIDER_SITE_OTHER): Payer: PRIVATE HEALTH INSURANCE

## 2012-09-23 VITALS — BP 134/84 | HR 71 | Temp 98.3°F | Ht 72.0 in | Wt 156.0 lb

## 2012-09-23 DIAGNOSIS — I10 Essential (primary) hypertension: Secondary | ICD-10-CM

## 2012-09-23 DIAGNOSIS — F329 Major depressive disorder, single episode, unspecified: Secondary | ICD-10-CM

## 2012-09-23 DIAGNOSIS — E785 Hyperlipidemia, unspecified: Secondary | ICD-10-CM

## 2012-09-23 DIAGNOSIS — E119 Type 2 diabetes mellitus without complications: Secondary | ICD-10-CM

## 2012-09-23 DIAGNOSIS — Z23 Encounter for immunization: Secondary | ICD-10-CM

## 2012-09-23 LAB — CBC WITH DIFFERENTIAL/PLATELET
Basophils Absolute: 0 10*3/uL (ref 0.0–0.1)
Eosinophils Absolute: 0.1 10*3/uL (ref 0.0–0.7)
HCT: 40.9 % (ref 39.0–52.0)
Hemoglobin: 13.5 g/dL (ref 13.0–17.0)
Lymphs Abs: 0.8 10*3/uL (ref 0.7–4.0)
MCHC: 33.1 g/dL (ref 30.0–36.0)
Monocytes Absolute: 0.5 10*3/uL (ref 0.1–1.0)
Monocytes Relative: 8.4 % (ref 3.0–12.0)
Neutro Abs: 4.5 10*3/uL (ref 1.4–7.7)
Platelets: 235 10*3/uL (ref 150.0–400.0)
RDW: 14.9 % — ABNORMAL HIGH (ref 11.5–14.6)

## 2012-09-23 LAB — BASIC METABOLIC PANEL
CO2: 29 mEq/L (ref 19–32)
Calcium: 9.1 mg/dL (ref 8.4–10.5)
GFR: 92.81 mL/min (ref >60.00)
Glucose, Bld: 473 mg/dL — ABNORMAL HIGH (ref 70–99)
Potassium: 4.2 mEq/L (ref 3.5–5.1)
Sodium: 133 mEq/L — ABNORMAL LOW (ref 135–145)

## 2012-09-23 LAB — LDL CHOLESTEROL, DIRECT: Direct LDL: 192.9 mg/dL

## 2012-09-23 LAB — URINALYSIS, ROUTINE W REFLEX MICROSCOPIC
Ketones, ur: NEGATIVE
Leukocytes, UA: NEGATIVE
Specific Gravity, Urine: 1.01 (ref 1.000–1.030)
Urine Glucose: >=1000
Urobilinogen, UA: 0.2 (ref 0.0–1.0)
pH: 6 (ref 5.0–8.0)

## 2012-09-23 LAB — HEPATIC FUNCTION PANEL
AST: 22 U/L (ref 0–37)
Albumin: 3.9 g/dL (ref 3.5–5.2)
Alkaline Phosphatase: 125 U/L — ABNORMAL HIGH (ref 39–117)
Bilirubin, Direct: 0.1 mg/dL (ref 0.0–0.3)

## 2012-09-23 LAB — TSH: TSH: 0.35 u[IU]/mL (ref 0.35–5.50)

## 2012-09-23 LAB — LIPID PANEL: Total CHOL/HDL Ratio: 5

## 2012-09-23 MED ORDER — LANCETS MISC: 1.0000 "application " | Freq: Every day | Status: DC

## 2012-09-23 MED ORDER — GLUCOSE BLOOD VI STRP: ORAL_STRIP | Status: DC

## 2012-09-23 MED ORDER — PIOGLITAZONE HCL 45 MG PO TABS: 45.0000 mg | ORAL_TABLET | Freq: Every day | ORAL | Status: DC

## 2012-09-23 MED ORDER — GLIMEPIRIDE 4 MG PO TABS: ORAL_TABLET | ORAL | Status: DC

## 2012-09-23 MED ORDER — LISINOPRIL 5 MG PO TABS: 5.0000 mg | ORAL_TABLET | Freq: Every day | ORAL | Status: DC

## 2012-09-23 MED ORDER — METFORMIN HCL ER 500 MG PO TB24: 1000.0000 mg | ORAL_TABLET | Freq: Every day | ORAL | Status: DC

## 2012-09-23 MED ORDER — SITAGLIPTIN PHOSPHATE 100 MG PO TABS: 100.0000 mg | ORAL_TABLET | Freq: Every day | ORAL | Status: DC

## 2012-09-23 NOTE — Progress Notes (Signed)
  Subjective:    Patient ID: Jake Chen, male    DOB: 07/26/1950, 62 y.o.   MRN: [id number]  HPI    Here to f/u after lost to f/u since oct 2013 -   Lost wt from oct 2013  - 165 , to current 156.  Has obamacare for major medical, but deductible is about $2000, cannot afford meds, has not taken any meds for 1 yr. Januvia is over $200.  + polydipsia, polyuria, eats chips with salt at bedtime to avoid getting up so much at night.   Feels dehydrated.  Has some cognitive difficultuies, memory is more difficult, lives 2 hrs from here, ETOH has increased in the past yr, mostly wine about 3-4 bottles per wk, plus some beer.  C/o balance worsening, justs tends to want to whole body move to one direction, not dizziness or lightheaded, lives alone in [place], 2 hrs from here. Visiting a friend in [place] over the weekend, and urged per friend to come today. Declines statin, but willing to re-start other meds Past Medical History  Diagnosis Date  . Depression 01/26/2011  . Erectile dysfunction 01/26/2011  . 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 Anxiety and depression 07/19/2011  . DIABETES MELLITUS, TYPE II 12/24/2006    Qualifier: Diagnosis of  By: Jonny Ruiz MD, Len Blalock   . HYPERLIPIDEMIA 12/24/2006    Qualifier: Diagnosis of  By: Jonny Ruiz MD, Len Blalock   . HYPERTENSION 12/24/2006    Qualifier: Diagnosis of  By: Jonny Ruiz MD, Len Blalock   . PSA, INCREASED 12/25/2006    Qualifier: Diagnosis of  By: Jonny Ruiz MD, Len Blalock    No past surgical history on file.  reports that he has never smoked. He has never used smokeless tobacco. His alcohol and drug histories are not on file. family history is not on file. No Known Allergies No current outpatient prescriptions on file prior to visit.   No current facility-administered medications on file prior to visit.     Review of Systems  Constitutional: Negative for unexpected weight change, or unusual  diaphoresis  HENT: Negative for tinnitus.   Eyes: Negative for photophobia and visual disturbance.  Respiratory: Negative for choking and stridor.   Gastrointestinal: Negative for vomiting and blood in stool.  Genitourinary: Negative for hematuria and decreased urine volume.  Musculoskeletal: Negative for acute joint swelling Skin: Negative for color change and wound.  Neurological: Negative for tremors and numbness other than noted  Psychiatric/Behavioral: Negative for decreased concentration or  hyperactivity.       Objective:   Physical Exam BP 134/84  Pulse 71  Temp(Src) 98.3 F (36.8 C) (Oral)  Ht 6' (1.829 m)  Wt 156 lb (70.761 kg)  BMI 21.15 kg/m2  SpO2 97% VS noted,  Constitutional: Pt appears thin, mild cachectic  HENT: Head: NCAT.  Right Ear: External ear normal.  Left Ear: External ear normal.  Eyes: Conjunctivae and EOM are normal. Pupils are equal, round, and reactive to light.  Neck: Normal range of motion. Neck supple.  Cardiovascular: Normal rate and regular rhythm.   Pulmonary/Chest: Effort normal and breath sounds normal.  Abd:  Soft, NT, non-distended, + BS Neurological: Pt is alert. Not confused  Skin: Skin is warm. No erythema.  Psychiatric: Pt behavior is normal. Thought content normal.     Assessment & Plan:

## 2012-09-23 NOTE — Patient Instructions (Signed)
You had the flu shot today Please re-start your medications as prescribed today Please also take Aspirin 81 mg - 1 per day (Enteric coated only) to reduce risk of stroke and heart disease You will be contacted regarding the referral for: Endocrinology Please call if you change your mind about the MRI for the brain, and Neurology referral Please go to the LAB in the Basement (turn left off the elevator) for the tests to be done today You will be contacted by phone if any changes need to be made immediately.  Otherwise, you will receive a letter about your results with an explanation, but please check with MyChart first.  Please return in 6 months, or sooner if needed

## 2012-09-23 NOTE — Assessment & Plan Note (Signed)
Unclear control, will hold on statin for now, check labs

## 2012-09-23 NOTE — Assessment & Plan Note (Addendum)
Very likely severely uncontrolled, for med re-start, check labs, refer endo  Note:  Total time for pt hx, exam, review of record with pt in the room, determination of diagnoses and plan for further eval and tx is > 40 min, with over 50% spent in coordination and counseling of patient

## 2012-09-23 NOTE — Assessment & Plan Note (Signed)
decliens med tx, counseling or psych referral

## 2012-09-23 NOTE — Assessment & Plan Note (Addendum)
stable overall by history and exam, recent data reviewed with pt, and pt to re-start aceI, medical treatment as before,  to f/u any worsening symptoms or concerns, also for asa 81 mg qd BP Readings from Last 3 Encounters:  09/23/12 134/84  10/30/11 118/82  07/19/11 132/78

## 2012-09-25 ENCOUNTER — Encounter: Payer: Self-pay | Admitting: Internal Medicine

## 2012-10-01 ENCOUNTER — Ambulatory Visit: Payer: PRIVATE HEALTH INSURANCE | Admitting: Endocrinology

## 2012-10-01 DIAGNOSIS — Z0289 Encounter for other administrative examinations: Secondary | ICD-10-CM

## 2012-10-02 ENCOUNTER — Telehealth: Payer: Self-pay

## 2012-10-02 MED ORDER — LINAGLIPTIN 5 MG PO TABS: 5.0000 mg | ORAL_TABLET | Freq: Every day | ORAL | Status: DC

## 2012-10-02 MED ORDER — GLUCOSE BLOOD VI STRP: ORAL_STRIP | Status: DC

## 2012-10-02 NOTE — Telephone Encounter (Signed)
Called left message to call back 

## 2012-10-02 NOTE — Telephone Encounter (Signed)
1) ok to change januvia to tradjenta  2)  Ok for freestyle strips  - done erx  3)  Ok for PA to do for generic actos

## 2012-10-02 NOTE — Telephone Encounter (Signed)
Received pharmacy rejection stating that insurance will not cover januvia, pioglitazone, or Onetouch strips without a prior authorization. [place] at [phone] spoke with rep who provided to follow ing information;  1)Januvia denied due to step therapy process, must try and fail Onglyza and Trajenta  2)Onetouch supplies denied, must use freestyle products  3)Pioglitazone denied, must have failed at leaset 1500mg  of metformin. I advised that pt has failed metformin, they will fax over exception form to  [phone].

## 2012-10-02 NOTE — Telephone Encounter (Signed)
Patient informed of PA information on meds. And changes made due to denials.

## 2012-10-07 ENCOUNTER — Telehealth: Payer: Self-pay

## 2012-10-07 NOTE — Telephone Encounter (Signed)
Received denial from [facility] for Pioglitazone stated they only cover for members with type 2 diabetes who have not had adequate response to Metformin (at least 1500mg /day), please advise on alternative

## 2012-10-07 NOTE — Telephone Encounter (Signed)
Can we try again, b/c pt is taking metformin, and most recent a1c is very high

## 2012-10-08 NOTE — Telephone Encounter (Signed)
Will do PA again

## 2012-10-09 NOTE — Telephone Encounter (Signed)
Called [employer] this morning and they will not approve Pioglitazone unless he has been on Metformin 1500mg /day.  Please advise

## 2012-10-09 NOTE — Telephone Encounter (Signed)
Noted, will make no changes now, pt to f/u with endo as planned

## 2012-10-17 ENCOUNTER — Telehealth: Payer: Self-pay

## 2012-10-17 NOTE — Telephone Encounter (Signed)
I really am at a loss as well.  The pt lives in [place] now and has financial difficulties;  The best I can say is he may want to apply for Medicaid in [place] asap, as this would help greatly in being to afford the medications

## 2012-10-17 NOTE — Telephone Encounter (Signed)
Called the patient informed of MD instructions. 

## 2012-10-17 NOTE — Telephone Encounter (Signed)
Received phone call from the patient this morning.  He stated he has only received his strips from the pharmacy and no other medications as were not approved.  Not sure what else to do, but the patient has no medication at this time.  Please advise

## 2012-10-21 ENCOUNTER — Telehealth: Payer: Self-pay

## 2012-10-21 MED ORDER — LISINOPRIL 5 MG PO TABS: 5.0000 mg | ORAL_TABLET | Freq: Every day | ORAL | Status: DC

## 2012-10-21 MED ORDER — METFORMIN HCL ER 500 MG PO TB24: 1000.0000 mg | ORAL_TABLET | Freq: Every day | ORAL | Status: DC

## 2012-10-21 MED ORDER — GLUCOSE BLOOD VI STRP: ORAL_STRIP | Status: DC

## 2012-10-21 MED ORDER — GLIMEPIRIDE 4 MG PO TABS: ORAL_TABLET | ORAL | Status: DC

## 2012-10-21 NOTE — Telephone Encounter (Signed)
[facility]

## 2012-10-24 ENCOUNTER — Telehealth: Payer: Self-pay

## 2012-10-24 NOTE — Telephone Encounter (Signed)
Attmpted to get PA approval for the patients Actos and Januvia, but [employer] did Deny at this time.  The patient has been informed.  Stated he is out of town at this time and will call  Back once home.

## 2013-07-13 ENCOUNTER — Telehealth: Payer: Self-pay | Admitting: *Deleted

## 2013-07-13 NOTE — Telephone Encounter (Signed)
Spoke with patient and he declined office visit/ labs because he "can not afford it".  Diabetic bundle.

## 2013-08-20 ENCOUNTER — Telehealth: Payer: Self-pay | Admitting: Internal Medicine

## 2013-08-20 NOTE — Telephone Encounter (Signed)
Tried calling to schedule CPE.  We have incorrect phone information.

## 2013-10-23 ENCOUNTER — Other Ambulatory Visit: Payer: Self-pay

## 2013-12-24 ENCOUNTER — Telehealth: Payer: Self-pay | Admitting: Internal Medicine

## 2013-12-24 MED ORDER — "PEN NEEDLES 5/16"" 31G X 8 MM MISC": Status: DC

## 2013-12-24 NOTE — Telephone Encounter (Signed)
Called the patient left msg. To call back 

## 2013-12-24 NOTE — Telephone Encounter (Signed)
Pt request insuline needle of Detemir to be send to [facility] phone [phone]. Please advise, this is new

## 2013-12-24 NOTE — Telephone Encounter (Signed)
Sent in pen needles as requested per phone call.

## 2014-01-15 ENCOUNTER — Telehealth: Payer: Self-pay | Admitting: Internal Medicine

## 2014-01-15 MED ORDER — METFORMIN HCL ER 500 MG PO TB24: 1000.0000 mg | ORAL_TABLET | Freq: Every day | ORAL | Status: DC

## 2014-01-15 NOTE — Telephone Encounter (Signed)
Pt needs metformin refill, [facility] ---[phone] in [place].

## 2014-01-25 ENCOUNTER — Telehealth: Payer: Self-pay | Admitting: Internal Medicine

## 2014-01-25 NOTE — Telephone Encounter (Signed)
[facility] - Client TELEPHONE ADVICE RECORD   [facility]    --------------------------------------------------------------------------------   Patient Name: Jake Chen Obst  Gender: Male  DOB: 1950/03/10   Age: [age] Y 10 M 6 D  Return Phone Number: [phone] (Primary)  Address:     City/State/Zip:  [place]     Client [facility] - Client  Client Site  [facility]  Physician Oliver BarreJohn, James   Contact Type Call  Call Type Triage / Clinical       Relationship To Patient Self       Return Phone Number [phone] (Primary)  Chief Complaint Abdominal Pain  Initial Comment Caller states he has stomach pain and getting nauseous.        PreDisposition Did not know what to do            Nurse Assessment  Nurse: Lovell SheehanJenkins, RN, Clarisse GougeBridget Date/Time [name](Eastern Time): 01/25/2014 4:04:47 PM  Confirm and document reason for call. If symptomatic, describe symptoms. ---Caller states he has stomach pain and getting nauseous. No fever. Pain is in the lower middle stomach area. Feels like getting kicked. Pain x 1 week. No vomiting or diarrhea.    Has the patient traveled out of the country within the last 30 days? ---Not Applicable    Does the patient require triage? ---Yes    Related visit to physician within the last 2 weeks? ---No    Does the PT have any chronic conditions? (i.e. diabetes, asthma, etc.) ---Yes    List chronic conditions. ---Diabetes           Guidelines          Guideline Title Affirmed Question Affirmed Notes Nurse Date/Time [name](Eastern Time)  Abdominal Pain - Male [1] MILD-MODERATE pain AND [2] constant AND [3] present > 2 hours    Lovell SheehanJenkins, RN, Clarisse GougeBridget 01/25/2014 4:05:08 PM    Disp. Time [name](Eastern Time) Disposition Final User         01/25/2014 4:12:54 PM See Physician within 4 Hours (or PCP triage) Yes Lovell SheehanJenkins, RN, Thayer DallasBridget            Caller Understands: Yes  Disagree/Comply: Disagree   Disagree/Comply Reason: Wait and see    Care Advice Given Per Guideline        SEE PHYSICIAN WITHIN 4 HOURS (or PCP triage): NOTHING BY MOUTH: Do not eat or drink anything for now. REST: Lie down and rest until seen. CALL BACK IF: * You become worse. CARE ADVICE given per Abdominal Pain, Male (Adult) guideline.    After Care Instructions Given        Call Event Type User Date / Time Description        --------------------------------------------------------------------------------         Comments  User: Pryor MontesBridget, Jenkins, RN Date/Time [name](Eastern Time): 01/25/2014 4:14:28 PM  Pt refused appt and refused to go to urgent care or ER. He refused the APPT because he lives 2 hours away and his ins will not be effective until March 1st. This is also the same reason he refused being seen at all. He does wish to have his insulin and medication changed because he does think this could be the problem.    Referrals  GO TO FACILITY REFUSED

## 2014-01-26 ENCOUNTER — Telehealth: Payer: Self-pay | Admitting: *Deleted

## 2014-01-26 NOTE — Telephone Encounter (Signed)
[facility] - Client TELEPHONE ADVICE RECORD [facility] Patient Name: Jake Chen  Gender: Male DOB: [birth year] Age: [age] Y 10 M 6 D Return Phone Number: [phone] (Primary) Address: City/State/Zip: [place] Client [address] [facility] - Client Client Site [facility] Physician Oliver BarreJohn, James Contact Type Call Call Type Triage / Clinical Relationship To Patient Self Appointment Disposition EMR Appointment Attempted - Not Scheduled Return Phone Number [phone] (Primary) Chief Complaint Abdominal Pain Initial Comment Caller states he has stomach pain and getting nauseous. PreDisposition Did not know what to do Nurse Assessment Nurse: Lovell SheehanJenkins, RN, Clarisse GougeBridget Date/Time [name](Eastern Time): 01/25/2014 4:04:47 PM Confirm and document reason for call. If symptomatic, describe symptoms. ---Caller states he has stomach pain and getting nauseous. No fever. Pain is in the lower middle stomach area. Feels like getting kicked. Pain x 1 week. No vomiting or diarrhea. Has the patient traveled out of the country within the last 30 days? ---Not Applicable Does the patient require triage? ---Yes Related visit to physician within the last 2 weeks? ---No Does the PT have any chronic conditions? (i.e. diabetes, asthma, etc.) ---Yes List chronic conditions. ---Diabetes Guidelines Guideline Title Affirmed Question Affirmed Notes Nurse Date/Time [name](Eastern Time) Abdominal Pain - Male [1] MILD-MODERATE pain AND [2] constant AND [3] present > 2 hours Lovell SheehanJenkins, RN, Clarisse GougeBridget 01/25/2014 4:05:08 PM Disp. Time [name](Eastern Time) Disposition Final User 01/25/2014 4:12:54 PM See Physician within 4 Hours (or PCP triage) Yes Lovell SheehanJenkins, RN, Thayer DallasBridget Caller Understands: Yes Disagree/Comply: Disagree PLEASE NOTE: All timestamps contained within this report are represented as [place] Standard Time. CONFIDENTIALTY NOTICE: This fax transmission is intended only for  the addressee. It contains information that is legally privileged, confidential or otherwise protected from use or disclosure. If you are not the intended recipient, you are strictly prohibited from reviewing, disclosing, copying using or disseminating any of this information or taking any action in reliance on or regarding this information. If you have received this fax in error, please notify us immediately by telephone so that we can arrange for its return to us. Phone: [phone], Toll-Free: [phone], Fax: [phone] Page: 2 of 2 Call Id: [id number] Disagree/Comply Reason: Wait and see Care Advice Given Per Guideline SEE PHYSICIAN WITHIN 4 HOURS (or PCP triage): NOTHING BY MOUTH: Do not eat or drink anything for now. REST: Lie down and rest until seen. CALL BACK IF: * You become worse. CARE ADVICE given per Abdominal Pain, Male (Adult) guideline. After Care Instructions Given Call Event Type User Date / Time Description Comments User: Pryor MontesBridget, Jenkins, RN Date/Time [name](Eastern Time): 01/25/2014 4:14:28 PM Pt refused appt and refused to go to urgent care or ER. He refused the APPT because he lives 2 hours away and his ins will not be effective until March 1st. This is also the same reason he refused being seen at all. He does wish to have his insulin and medication changed because he does think this could be the problem. Referrals GO TO FACILITY REFUSED

## 2014-01-28 ENCOUNTER — Telehealth: Payer: Self-pay | Admitting: Internal Medicine

## 2014-01-28 NOTE — Telephone Encounter (Signed)
Patient informed of PCP instructions.  Patient is unable to do anything at this time as he has no insurance until March.

## 2014-01-28 NOTE — Telephone Encounter (Signed)
The pain is not likely from the insulin shots, except if there is tender, red or swelling at the site of injection  Please consider OV, but with inclement weather looming and living 2 hrs away, it may be best to present to [facility] (which is in our system and usually does not have the long waits of the other ER's), or a local UC or ER

## 2014-01-28 NOTE — Telephone Encounter (Signed)
Pt called stated that she is having stomach pain, he think this might be coming from insulin. Please call pt, offer an appt but pt stated he live 2 hours away.

## 2014-02-01 ENCOUNTER — Telehealth: Payer: Self-pay | Admitting: Internal Medicine

## 2014-02-01 NOTE — Telephone Encounter (Signed)
Called pt concerning msg below. Pt states he believe one of his medicine is causing his abdominal pain. Wanting md to stop his insulin or metformin. Inform pt he must be seen before md can change any medications. He hasn't seen md since 09/2012. Pt stated due to not having insurance he is not able to come in until March 1st when he get his medicare. Again inform pt we can't make any med change because md will need to see him. Did advise if abdominal pain is severe to goto Er. Again pt goes on about not having insurance just want to stop taking his insulin or metformin...Raechel Chute/lmb

## 2014-02-01 NOTE — Telephone Encounter (Signed)
Patient called stating he is having terrible abdominal pain and cannot get out of bed. He believes it is due to some of his medications. He would like a call back at [phone].

## 2014-03-09 ENCOUNTER — Ambulatory Visit (INDEPENDENT_AMBULATORY_CARE_PROVIDER_SITE_OTHER)
Admission: RE | Admit: 2014-03-09 | Discharge: 2014-03-09 | Disposition: A | Discharge status: Home / Self Care | Payer: Medicare Other | Source: Ambulatory Visit | Attending: Internal Medicine | Admitting: Internal Medicine

## 2014-03-09 ENCOUNTER — Ambulatory Visit (INDEPENDENT_AMBULATORY_CARE_PROVIDER_SITE_OTHER): Payer: Medicare Other | Admitting: Internal Medicine

## 2014-03-09 ENCOUNTER — Encounter: Payer: Self-pay | Admitting: Internal Medicine

## 2014-03-09 ENCOUNTER — Other Ambulatory Visit (INDEPENDENT_AMBULATORY_CARE_PROVIDER_SITE_OTHER): Payer: Medicare Other

## 2014-03-09 VITALS — BP 128/82 | HR 94 | Temp 98.4°F | Resp 18 | Ht 72.0 in | Wt 143.0 lb

## 2014-03-09 DIAGNOSIS — F32A Depression, unspecified: Secondary | ICD-10-CM

## 2014-03-09 DIAGNOSIS — R634 Abnormal weight loss: Secondary | ICD-10-CM

## 2014-03-09 DIAGNOSIS — E119 Type 2 diabetes mellitus without complications: Secondary | ICD-10-CM

## 2014-03-09 DIAGNOSIS — I10 Essential (primary) hypertension: Secondary | ICD-10-CM | POA: Diagnosis not present

## 2014-03-09 DIAGNOSIS — R972 Elevated prostate specific antigen [PSA]: Secondary | ICD-10-CM

## 2014-03-09 DIAGNOSIS — E785 Hyperlipidemia, unspecified: Secondary | ICD-10-CM | POA: Diagnosis not present

## 2014-03-09 DIAGNOSIS — N320 Bladder-neck obstruction: Secondary | ICD-10-CM

## 2014-03-09 DIAGNOSIS — F329 Major depressive disorder, single episode, unspecified: Secondary | ICD-10-CM | POA: Diagnosis not present

## 2014-03-09 DIAGNOSIS — E1165 Type 2 diabetes mellitus with hyperglycemia: Secondary | ICD-10-CM

## 2014-03-09 DIAGNOSIS — IMO0001 Reserved for inherently not codable concepts without codable children: Secondary | ICD-10-CM | POA: Insufficient documentation

## 2014-03-09 DIAGNOSIS — R0602 Shortness of breath: Secondary | ICD-10-CM | POA: Diagnosis not present

## 2014-03-09 LAB — MICROALBUMIN / CREATININE URINE RATIO
CREATININE, U: 61.3 mg/dL
MICROALB UR: 2.1 mg/dL — AB (ref 0.0–1.9)
MICROALB/CREAT RATIO: 3.4 mg/g (ref 0.0–30.0)

## 2014-03-09 LAB — CBC WITH DIFFERENTIAL/PLATELET
Basophils Absolute: 0 10*3/uL (ref 0.0–0.1)
Basophils Relative: 0.2 % (ref 0.0–3.0)
EOS PCT: 0.8 % (ref 0.0–5.0)
Eosinophils Absolute: 0 10*3/uL (ref 0.0–0.7)
HCT: 43.2 % (ref 39.0–52.0)
Hemoglobin: 14.3 g/dL (ref 13.0–17.0)
LYMPHS PCT: 25.6 % (ref 12.0–46.0)
Lymphs Abs: 1.3 10*3/uL (ref 0.7–4.0)
MCHC: 33.1 g/dL (ref 30.0–36.0)
MCV: 82.8 fl (ref 78.0–100.0)
MONO ABS: 0.4 10*3/uL (ref 0.1–1.0)
Monocytes Relative: 7.9 % (ref 3.0–12.0)
Neutro Abs: 3.2 10*3/uL (ref 1.4–7.7)
Neutrophils Relative %: 65.5 % (ref 43.0–77.0)
PLATELETS: 275 10*3/uL (ref 150.0–400.0)
RBC: 5.22 Mil/uL (ref 4.22–5.81)
RDW: 14.8 % (ref 11.5–15.5)
WBC: 4.9 10*3/uL (ref 4.0–10.5)

## 2014-03-09 LAB — URINALYSIS, ROUTINE W REFLEX MICROSCOPIC
Bilirubin Urine: NEGATIVE
Hgb urine dipstick: NEGATIVE
Leukocytes, UA: NEGATIVE
Nitrite: NEGATIVE
PH: 5 (ref 5.0–8.0)
RBC / HPF: NONE SEEN (ref 0–?)
SPECIFIC GRAVITY, URINE: 1.025 (ref 1.000–1.030)
Total Protein, Urine: NEGATIVE
Urobilinogen, UA: 0.2 (ref 0.0–1.0)
WBC, UA: NONE SEEN (ref 0–?)

## 2014-03-09 LAB — SEDIMENTATION RATE: Sed Rate: 12 mm/hr (ref 0–22)

## 2014-03-09 LAB — HEMOGLOBIN A1C: Hgb A1c MFr Bld: 13.1 % — ABNORMAL HIGH (ref 4.6–6.5)

## 2014-03-09 MED ORDER — ONETOUCH ULTRA 2 W/DEVICE KIT: PACK | Status: DC

## 2014-03-09 MED ORDER — GLIPIZIDE 10 MG PO TABS: 10.0000 mg | ORAL_TABLET | Freq: Two times a day (BID) | ORAL | Status: DC

## 2014-03-09 MED ORDER — GLUCOSE BLOOD VI STRP: ORAL_STRIP | Status: DC

## 2014-03-09 MED ORDER — METFORMIN HCL ER 500 MG PO TB24: 1000.0000 mg | ORAL_TABLET | Freq: Every day | ORAL | Status: DC

## 2014-03-09 NOTE — Progress Notes (Signed)
   Subjective:    Patient ID: Jake Chen, male    DOB: July 20, 1950, 64 y.o.   MRN: [id number]  HPI  Here to fu after lost to f/u over 1 yr, c/o ongoing wt loss with polydipsia, polyuria, now here on Day #1 of medicare insurance coverage.dm Wt Readings from Last 3 Encounters:  03/09/14 143 lb 0.6 oz (64.883 kg)  09/23/12 156 lb (70.761 kg)  10/30/11 165 lb 4 oz (74.957 kg)  Here to f/u; overall doing ok,  Pt denies chest pain, increased sob or doe, wheezing, orthopnea, PND, increased LE swelling, palpitations, dizziness or syncope. Pt denies new neurological symptoms such as new headache, or facial or extremity weakness or numbness.   Pt states has been out of meds for several months.   Past Medical History  Diagnosis Date  . Depression 01/26/2011  . Erectile dysfunction 01/26/2011  . ADJ DISORDER WITH MIXED ANXIETY & DEPRESSED MOOD 02/23/2010    Qualifier: Diagnosis of  By: Jonny RuizJohn MD, Len BlalockJames W   . Anxiety and depression 07/19/2011  . DIABETES MELLITUS, TYPE II 12/24/2006    Qualifier: Diagnosis of  By: Jonny RuizJohn MD, Len BlalockJames W   . HYPERLIPIDEMIA 12/24/2006    Qualifier: Diagnosis of  By: Jonny RuizJohn MD, Len BlalockJames W   . HYPERTENSION 12/24/2006    Qualifier: Diagnosis of  By: Jonny RuizJohn MD, Len BlalockJames W   . PSA, INCREASED 12/25/2006    Qualifier: Diagnosis of  By: Jonny RuizJohn MD, Len BlalockJames W   . Diabetes 03/09/2014   No past surgical history on file.  reports that he has never smoked. He has never used smokeless tobacco. His alcohol and drug histories are not on file. family history is not on file. No Known Allergies No current outpatient prescriptions on file prior to visit.   No current facility-administered medications on file prior to visit.   Review of Systems  Constitutional: Negative for unusual diaphoresis or other sweats  HENT: Negative for ringing in ear Eyes: Negative for double vision or worsening visual disturbance.  Respiratory: Negative for choking and stridor.   Gastrointestinal: Negative for vomiting or  other signifcant bowel change Genitourinary: Negative for hematuria or decreased urine volume.  Musculoskeletal: Negative for other MSK pain or swelling Skin: Negative for color change and worsening wound.  Neurological: Negative for tremors and numbness other than noted  Psychiatric/Behavioral: Negative for decreased concentration or agitation other than above  '    Objective:   Physical Exam BP 128/82 mmHg  Pulse 94  Temp(Src) 98.4 F (36.9 C) (Oral)  Resp 18  Ht 6' (1.829 m)  Wt 143 lb 0.6 oz (64.883 kg)  BMI 19.40 kg/m2  SpO2 97% VS noted,  Constitutional: Pt appears thin/ malnourished HENT: Head: NCAT.  Right Ear: External ear normal.  Left Ear: External ear normal.  Eyes: . Pupils are equal, round, and reactive to light. Conjunctivae and EOM are normal Neck: Normal range of motion. Neck supple.  Cardiovascular: Normal rate and regular rhythm.   Pulmonary/Chest: Effort normal and breath sounds without rales or wheezing.  Abd:  Soft, NT, ND, + BS Neurological: Pt is alert. Not confused , motor grossly intact Skin: Skin is warm. No rash Psychiatric: Pt behavior is normal. No agitation. + depressed affect     Assessment & Plan:

## 2014-03-09 NOTE — Patient Instructions (Signed)
We will try to refer you to at [facility] in [place], [place]  (you [place] in [place] so this might not work)  Please take all new medication as prescribed - the glipizide 10 mg twice per day, and the metformin ER 500 mg - 4 pills per day  Please continue all other medications as before, and refills have been done if requested.  Please have the pharmacy call with any other refills you may need.  Please continue your efforts at being more active, low cholesterol diabetic diet, and weight control.  Please keep your appointments with your specialists as you may have planned  Please call if you change your mind about the medication for depression  Please go to the LAB in the Basement (turn left off the elevator) for the tests to be done today  Please go to the XRAY Department in the Basement (go straight as you get off the elevator) for the x-ray testing  You will be contacted by phone if any changes need to be made immediately.  Otherwise, you will receive a letter about your results with an explanation, but please check with MyChart first.  Please remember to sign up for MyChart if you have not done so, as this will be important to you in the future with finding out test results, communicating by private email, and scheduling acute appointments online when needed.  Please return in 3 months, or sooner if needed  You may be better off establishing with a local MD in your [place] county as they may be able to help better with respect to resoures available with services and medications

## 2014-03-09 NOTE — Progress Notes (Signed)
Pre visit review using our clinic review tool, if applicable. No additional management support is needed unless otherwise documented below in the visit note. 

## 2014-03-10 ENCOUNTER — Other Ambulatory Visit: Payer: Self-pay | Admitting: Internal Medicine

## 2014-03-10 DIAGNOSIS — R634 Abnormal weight loss: Secondary | ICD-10-CM

## 2014-03-10 DIAGNOSIS — R9389 Abnormal findings on diagnostic imaging of other specified body structures: Secondary | ICD-10-CM

## 2014-03-10 LAB — TSH: TSH: 1.1 u[IU]/mL (ref 0.35–4.50)

## 2014-03-10 LAB — BASIC METABOLIC PANEL
BUN: 14 mg/dL (ref 6–23)
CALCIUM: 9.3 mg/dL (ref 8.4–10.5)
CO2: 27 mEq/L (ref 19–32)
Chloride: 100 mEq/L (ref 96–112)
Creatinine, Ser: 1.17 mg/dL (ref 0.40–1.50)
GFR: 66.5 mL/min (ref >60.00)
Glucose, Bld: 397 mg/dL — ABNORMAL HIGH (ref 70–99)
Potassium: 4.2 mEq/L (ref 3.5–5.1)
Sodium: 137 mEq/L (ref 135–145)

## 2014-03-10 LAB — HEPATIC FUNCTION PANEL
ALT: 11 U/L (ref 0–53)
AST: 14 U/L (ref 0–37)
Albumin: 4.1 g/dL (ref 3.5–5.2)
Alkaline Phosphatase: 99 U/L (ref 39–117)
Bilirubin, Direct: 0.1 mg/dL (ref 0.0–0.3)
TOTAL PROTEIN: 7.2 g/dL (ref 6.0–8.3)
Total Bilirubin: 0.5 mg/dL (ref 0.2–1.2)

## 2014-03-10 LAB — LIPID PANEL
Cholesterol: 223 mg/dL — ABNORMAL HIGH (ref 0–200)
HDL: 64.2 mg/dL (ref >39.00)
LDL Cholesterol: 130 mg/dL — ABNORMAL HIGH (ref 0–99)
NonHDL: 158.8
Total CHOL/HDL Ratio: 3
Triglycerides: 142 mg/dL (ref 0.0–149.0)
VLDL: 28.4 mg/dL (ref 0.0–40.0)

## 2014-03-10 LAB — PSA: PSA: 2.9 ng/mL (ref 0.10–4.00)

## 2014-03-11 LAB — PROTEIN ELECTROPHORESIS, SERUM
Albumin ELP: 58.5 % (ref 55.8–66.1)
Alpha-1-Globulin: 3.5 % (ref 2.9–4.9)
Alpha-2-Globulin: 8.3 % (ref 7.1–11.8)
Beta 2: 2.3 % — ABNORMAL LOW (ref 3.2–6.5)
Beta Globulin: 5.6 % (ref 4.7–7.2)
GAMMA GLOBULIN: 21.8 % — AB (ref 11.1–18.8)
TOTAL PROTEIN, SERUM ELECTROPHOR: 7.2 g/dL (ref 6.0–8.3)

## 2014-03-12 ENCOUNTER — Ambulatory Visit (INDEPENDENT_AMBULATORY_CARE_PROVIDER_SITE_OTHER)
Admission: RE | Admit: 2014-03-12 | Discharge: 2014-03-12 | Disposition: A | Discharge status: Home / Self Care | Payer: Medicare Other | Source: Ambulatory Visit | Attending: Internal Medicine | Admitting: Internal Medicine

## 2014-03-12 DIAGNOSIS — R634 Abnormal weight loss: Secondary | ICD-10-CM | POA: Diagnosis not present

## 2014-03-12 DIAGNOSIS — R938 Abnormal findings on diagnostic imaging of other specified body structures: Secondary | ICD-10-CM | POA: Diagnosis not present

## 2014-03-12 DIAGNOSIS — R9389 Abnormal findings on diagnostic imaging of other specified body structures: Secondary | ICD-10-CM

## 2014-03-12 DIAGNOSIS — R918 Other nonspecific abnormal finding of lung field: Secondary | ICD-10-CM | POA: Diagnosis not present

## 2014-03-13 NOTE — Assessment & Plan Note (Addendum)
For med res-tart, declines insulins such as 70/30, for labs,  to f/u any worsening symptoms or concerns \ Note:  Total time for pt hx, exam, review of record with pt in the room, determination of diagnoses and plan for further eval and tx is > 40 min, with over 50% spent in coordination and counseling of patient

## 2014-03-13 NOTE — Assessment & Plan Note (Signed)
Likely related to uncontrolled DM, to f/u any worsening symptoms or concerns

## 2014-03-13 NOTE — Assessment & Plan Note (Signed)
Ongoing, declines tx, denies SI

## 2014-03-13 NOTE — Assessment & Plan Note (Signed)
stable overall by history and exam, recent data reviewed with pt, and pt to continue medical treatment as before,  to f/u any worsening symptoms or concerns BP Readings from Last 3 Encounters:  03/09/14 128/82  09/23/12 134/84  10/30/11 118/82

## 2014-03-13 NOTE — Assessment & Plan Note (Signed)
stable overall by history and exam, and pt to continue medical treatment as before,  to f/u any worsening symptoms or concerns, for f/u labs 

## 2014-03-13 NOTE — Assessment & Plan Note (Signed)
Also for psa as he is due 

## 2014-03-17 ENCOUNTER — Telehealth: Payer: Self-pay | Admitting: Internal Medicine

## 2014-03-17 NOTE — Telephone Encounter (Signed)
Patient is requesting a call regarding chest xray. i read the result notes from 03/12/2014 xray, but patient is concerned about verbiage and that it was mixed up with medication.

## 2014-03-18 NOTE — Telephone Encounter (Signed)
Left message for patient to return call.

## 2014-03-18 NOTE — Telephone Encounter (Signed)
Patient returned call and I informed him of his xray results.

## 2014-03-25 ENCOUNTER — Telehealth: Payer: Self-pay | Admitting: Internal Medicine

## 2014-03-25 NOTE — Telephone Encounter (Signed)
Ok,noted

## 2014-03-25 NOTE — Telephone Encounter (Signed)
Pt called in said that he was taking 2 tabs in morning and 2 in evening of the metformin.  He was taking the wrong amount.  He has stopped the 2 in the evening .  He just wanted to let Dr Jonny Ruizjohn know he has been doing that for about 60 days

## 2014-06-25 ENCOUNTER — Ambulatory Visit: Payer: Medicare Other | Admitting: Internal Medicine

## 2014-06-30 ENCOUNTER — Telehealth: Payer: Self-pay | Admitting: *Deleted

## 2014-06-30 NOTE — Telephone Encounter (Signed)
Patient will call to schedule a1c

## 2014-07-05 ENCOUNTER — Other Ambulatory Visit: Payer: Self-pay

## 2014-08-02 ENCOUNTER — Inpatient Hospital Stay (HOSPITAL_COMMUNITY): Payer: Medicare Other

## 2014-08-02 ENCOUNTER — Encounter (HOSPITAL_COMMUNITY): Payer: Self-pay | Admitting: Internal Medicine

## 2014-08-02 ENCOUNTER — Inpatient Hospital Stay (HOSPITAL_COMMUNITY)
Admission: AD | Admit: 2014-08-02 | Discharge: 2014-08-05 | DRG: 065 | Disposition: A | Discharge status: Home Health | Payer: Medicare Other | Source: Other Acute Inpatient Hospital | Attending: Internal Medicine | Admitting: Internal Medicine

## 2014-08-02 DIAGNOSIS — G8191 Hemiplegia, unspecified affecting right dominant side: Secondary | ICD-10-CM | POA: Diagnosis present

## 2014-08-02 DIAGNOSIS — I10 Essential (primary) hypertension: Secondary | ICD-10-CM | POA: Diagnosis not present

## 2014-08-02 DIAGNOSIS — Z79899 Other long term (current) drug therapy: Secondary | ICD-10-CM

## 2014-08-02 DIAGNOSIS — I639 Cerebral infarction, unspecified: Secondary | ICD-10-CM | POA: Diagnosis not present

## 2014-08-02 DIAGNOSIS — I6789 Other cerebrovascular disease: Secondary | ICD-10-CM | POA: Diagnosis not present

## 2014-08-02 DIAGNOSIS — G319 Degenerative disease of nervous system, unspecified: Secondary | ICD-10-CM | POA: Diagnosis not present

## 2014-08-02 DIAGNOSIS — R2681 Unsteadiness on feet: Secondary | ICD-10-CM | POA: Diagnosis not present

## 2014-08-02 DIAGNOSIS — E1165 Type 2 diabetes mellitus with hyperglycemia: Secondary | ICD-10-CM | POA: Diagnosis not present

## 2014-08-02 DIAGNOSIS — I6339 Cerebral infarction due to thrombosis of other cerebral artery: Secondary | ICD-10-CM | POA: Diagnosis not present

## 2014-08-02 DIAGNOSIS — IMO0001 Reserved for inherently not codable concepts without codable children: Secondary | ICD-10-CM | POA: Diagnosis present

## 2014-08-02 DIAGNOSIS — R1310 Dysphagia, unspecified: Secondary | ICD-10-CM | POA: Diagnosis present

## 2014-08-02 DIAGNOSIS — I6523 Occlusion and stenosis of bilateral carotid arteries: Secondary | ICD-10-CM | POA: Diagnosis present

## 2014-08-02 DIAGNOSIS — E785 Hyperlipidemia, unspecified: Secondary | ICD-10-CM | POA: Diagnosis present

## 2014-08-02 DIAGNOSIS — R200 Anesthesia of skin: Secondary | ICD-10-CM | POA: Diagnosis not present

## 2014-08-02 DIAGNOSIS — R531 Weakness: Secondary | ICD-10-CM | POA: Diagnosis not present

## 2014-08-02 DIAGNOSIS — R471 Dysarthria and anarthria: Secondary | ICD-10-CM | POA: Diagnosis not present

## 2014-08-02 DIAGNOSIS — R4781 Slurred speech: Secondary | ICD-10-CM | POA: Diagnosis not present

## 2014-08-02 LAB — CBC
HEMATOCRIT: 42.6 % (ref 39.0–52.0)
Hemoglobin: 14.2 g/dL (ref 13.0–17.0)
MCH: 28.2 pg (ref 26.0–34.0)
MCHC: 33.3 g/dL (ref 30.0–36.0)
MCV: 84.7 fL (ref 78.0–100.0)
PLATELETS: 232 10*3/uL (ref 150–400)
RBC: 5.03 MIL/uL (ref 4.22–5.81)
RDW: 13.5 % (ref 11.5–15.5)
WBC: 4.4 10*3/uL (ref 4.0–10.5)

## 2014-08-02 LAB — CREATININE, SERUM
Creatinine, Ser: 0.76 mg/dL (ref 0.61–1.24)
GFR calc Af Amer: >60 mL/min (ref >60)
GFR calc non Af Amer: >60 mL/min (ref >60)

## 2014-08-02 LAB — GLUCOSE, CAPILLARY: Glucose-Capillary: 288 mg/dL — ABNORMAL HIGH (ref 65–99)

## 2014-08-02 MED ORDER — SENNOSIDES-DOCUSATE SODIUM 8.6-50 MG PO TABS
1.0000 | ORAL_TABLET | Freq: Every evening | ORAL | Status: DC | PRN
  Administered 2014-08-02: 1 via ORAL
  Filled 2014-08-02: qty 1

## 2014-08-02 MED ORDER — HEPARIN SODIUM (PORCINE) 5000 UNIT/ML IJ SOLN
5000.0000 [IU] | Freq: Three times a day (TID) | INTRAMUSCULAR | Status: DC
  Administered 2014-08-02 – 2014-08-05 (×8): 5000 [IU] via SUBCUTANEOUS
  Filled 2014-08-02 (×7): qty 1

## 2014-08-02 MED ORDER — INSULIN DETEMIR 100 UNIT/ML ~~LOC~~ SOLN
5.0000 [IU] | Freq: Every day | SUBCUTANEOUS | Status: DC
  Administered 2014-08-02 – 2014-08-03 (×2): 5 [IU] via SUBCUTANEOUS
  Filled 2014-08-02 (×3): qty 0.05

## 2014-08-02 MED ORDER — STROKE: EARLY STAGES OF RECOVERY BOOK
Freq: Once | Status: AC
  Administered 2014-08-02: 19:00:00

## 2014-08-02 MED ORDER — SODIUM CHLORIDE 0.9 % IV SOLN
INTRAVENOUS | Status: DC
  Administered 2014-08-02: 20:00:00 via INTRAVENOUS

## 2014-08-02 MED ORDER — ASPIRIN 300 MG RE SUPP: 300.0000 mg | Freq: Every day | RECTAL | Status: DC

## 2014-08-02 MED ORDER — ASPIRIN 325 MG PO TABS
325.0000 mg | ORAL_TABLET | Freq: Every day | ORAL | Status: DC
  Administered 2014-08-02 – 2014-08-05 (×4): 325 mg via ORAL
  Filled 2014-08-02 (×5): qty 1

## 2014-08-02 MED ORDER — ATORVASTATIN CALCIUM 40 MG PO TABS
40.0000 mg | ORAL_TABLET | Freq: Every day | ORAL | Status: DC
  Administered 2014-08-02 – 2014-08-04 (×3): 40 mg via ORAL
  Filled 2014-08-02 (×3): qty 1

## 2014-08-02 MED ORDER — HYDRALAZINE HCL 20 MG/ML IJ SOLN: 5.0000 mg | INTRAMUSCULAR | Status: DC | PRN

## 2014-08-02 NOTE — H&P (Signed)
[facility] History and Physical  Jake Chen DOB: 1950/07/24 DOA: 08/02/2014  Referring physician:  PCP: Cathlean Cower, MD   Chief Complaint: none  HPI: Jake Chen is a 64 y.o. male with past medical history of hypertension and diabetes for over 10 years that comes in for dysarthria and right leg weakness this started on the morning of admission he went to the ED in [place] where he was transferred to [facility]. Oliveri negative CT scan of the head that showed no acute bleeds. The patient relates has been having trouble swallowing and trouble with balance. He relates no dizziness no change in vision. He relates he does not been taking his aspirin regularly. He relates no falls. He was not taking aspirin at home.   Review of Systems:  Constitutional:  No weight loss, night sweats, Fevers, chills, fatigue.  HEENT:  No headaches, Difficulty swallowing,Tooth/dental problems,Sore throat,  No sneezing, itching, ear ache, nasal congestion, post nasal drip,  Cardio-vascular:  No chest pain, Orthopnea, PND, swelling in lower extremities, anasarca, dizziness, palpitations  GI:  No heartburn, indigestion, abdominal pain, nausea, vomiting, diarrhea, change in bowel habits, loss of appetite  Resp:  No shortness of breath with exertion or at rest. No excess mucus, no productive cough, No non-productive cough, No coughing up of blood.No change in color of mucus.No wheezing.No chest wall deformity  Skin:  no rash or lesions.  GU:  no dysuria, change in color of urine, no urgency or frequency. No flank pain.  Musculoskeletal:  No joint pain or swelling. No decreased range of motion. No back pain.  Psych:  No change in mood or affect. No depression or anxiety. No memory loss.   Past Medical History  Diagnosis Date  . Depression 01/26/2011  . Erectile dysfunction 01/26/2011  . ADJ DISORDER WITH MIXED ANXIETY & DEPRESSED MOOD 02/23/2010    Qualifier: Diagnosis of  By: Jenny Reichmann  MD, Hunt Oris   . Anxiety and depression 07/19/2011  . DIABETES MELLITUS, TYPE II 12/24/2006    Qualifier: Diagnosis of  By: Jenny Reichmann MD, Hunt Oris   . HYPERLIPIDEMIA 12/24/2006    Qualifier: Diagnosis of  By: Jenny Reichmann MD, Hunt Oris   . HYPERTENSION 12/24/2006    Qualifier: Diagnosis of  By: Jenny Reichmann MD, Hunt Oris   . PSA, INCREASED 12/25/2006    Qualifier: Diagnosis of  By: Jenny Reichmann MD, Hunt Oris   . Diabetes 03/09/2014   History reviewed. No pertinent past surgical history. Social History:  reports that he has never smoked. He has never used smokeless tobacco. He reports that he does not drink alcohol or use illicit drugs.  No Known Allergies  Family History  Problem Relation Age of Onset  . Cancer Mother   . Cancer Father      Prior to Admission medications   Medication Sig Start Date End Date Taking? Authorizing Provider  Blood Glucose Monitoring Suppl (ONE TOUCH ULTRA 2) W/DEVICE KIT Use as directed 03/09/14   Biagio Borg, MD  glipiZIDE (GLUCOTROL) 10 MG tablet Take 1 tablet (10 mg total) by mouth 2 (two) times daily before a meal. 03/09/14   Biagio Borg, MD  glucose blood (ONE TOUCH ULTRA TEST) test strip Use as instructed twice per day = type 2 DM uncontrolled 03/09/14   Biagio Borg, MD  Melatonin 10 MG CAPS Take 10 mg by mouth.    Historical Provider, MD  metFORMIN (GLUCOPHAGE-XR) 500 MG 24 hr tablet Take 2 tablets (1,000 mg total) by  mouth daily with breakfast. 03/09/14   Biagio Borg, MD   Physical Exam: There were no vitals filed for this visit.  Wt Readings from Last 3 Encounters:  03/09/14 64.883 kg (143 lb 0.6 oz)  09/23/12 70.761 kg (156 lb)  10/30/11 74.957 kg (165 lb 4 oz)    General:  Appears calm and comfortable Eyes: PERRL, normal lids, irises & conjunctiva ENT: grossly normal hearing, lips & tongue Neck: no LAD, masses or thyromegaly Cardiovascular: RRR, no m/r/g. No LE edema. Telemetry: SR, no arrhythmias  Respiratory: CTA bilaterally, no w/r/r. Normal respiratory effort. Abdomen:  soft, ntnd Skin: no rash or induration seen on limited exam Musculoskeletal: grossly normal tone BUE/BLE Psychiatric: grossly normal mood and affect, speech fluent and appropriate Neurologic: Through 12 are grossly intact except for 10 which he has dysarthria, muscle strength is followed 5 in all 4 extremities except on the right lower extremity which is 4 out of 5 deep tendon reflexes 2+ symmetrical bilaterally sensation is intact throughout, finger-to-nose is intact.           Labs on Admission:  Basic Metabolic Panel: No results for input(s): NA, K, CL, CO2, GLUCOSE, BUN, CREATININE, CALCIUM, MG, PHOS in the last 168 hours. Liver Function Tests: No results for input(s): AST, ALT, ALKPHOS, BILITOT, PROT, ALBUMIN in the last 168 hours. No results for input(s): LIPASE, AMYLASE in the last 168 hours. No results for input(s): AMMONIA in the last 168 hours. CBC: No results for input(s): WBC, NEUTROABS, HGB, HCT, MCV, PLT in the last 168 hours. Cardiac Enzymes: No results for input(s): CKTOTAL, CKMB, CKMBINDEX, TROPONINI in the last 168 hours.  BNP (last 3 results) No results for input(s): BNP in the last 8760 hours.  ProBNP (last 3 results) No results for input(s): PROBNP in the last 8760 hours.  CBG: No results for input(s): GLUCAP in the last 168 hours.  Radiological Exams on Admission: No results found.  EKG: Independently reviewed.pending  Assessment/Plan CVA (cerebral infarction) HgbA1c, fasting lipid panel  MRI, MRA of the brain without contrast  PT consult, OT consult, Speech consult  Echocardiogram  Carotid dopplers  Prophylactic therapy-Antiplatelet med: Aspirin - dose 325 mg PO daily  Cardiac Monitoring  Neurochecks q4h  Keep MAP 70  Hyperlipidemia: - statins started.  Essential hypertension Allow permisive HTN. Hold oral, hydralazine PRN until passes Swallowing evaluation.  Uncontrolled diabetes mellitus without complication: -npo, SSI, CBG's  q4hrs.    Code Status: full DVT Prophylaxis:heparin Family Communication: none Disposition Plan: inpatient 1-2 days  Time spent: 65 min  Charlynne Cousins [facility] Pager [phone]

## 2014-08-02 NOTE — Progress Notes (Signed)
Patient arrived to 4N09 from [facility] alert and oriented. HE denied any pain at this time. MD paged for orders. TELE applied and confirmed. Safety precautions reviewed. Will continue to monitor.   Sim Boast, RN

## 2014-08-02 NOTE — Consult Note (Signed)
Admission H&P    Chief Complaint: New onset slurred speech and right-sided weakness.  HPI: Jake Chen is an 64 y.o. male with a history of diabetes mellitus and hypertension presenting with new onset right hemiparesis and slurred speech. Symptoms were present 21 this morning. He was last known well at midnight last night. He has no previous history of stroke nor TIA. He has not been taking aspirin on a regular basis. CT scan of his head reportedly was unremarkable with no acute intracranial abnormality. MRI studies pending. NIH stroke score was 4.  LSN: 12:00 AM on 08/02/2014 tPA Given: No: Beyond time under for treatment consideration mRankin:  Past Medical History  Diagnosis Date  . Depression 01/26/2011  . Erectile dysfunction 01/26/2011  . 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 Anxiety and depression 07/19/2011  . DIABETES MELLITUS, TYPE II 12/24/2006    Qualifier: Diagnosis of  By: Jenny Reichmann MD, Hunt Oris   . HYPERLIPIDEMIA 12/24/2006    Qualifier: Diagnosis of  By: Jenny Reichmann MD, Hunt Oris   . HYPERTENSION 12/24/2006    Qualifier: Diagnosis of  By: Jenny Reichmann MD, Hunt Oris   . PSA, INCREASED 12/25/2006    Qualifier: Diagnosis of  By: Jenny Reichmann MD, Hunt Oris   . Diabetes 03/09/2014    History reviewed. No pertinent past surgical history.  Family History  Problem Relation Age of Onset  . Cancer Mother   . Cancer Father    Social History:  reports that he has never smoked. He has never used smokeless tobacco. He reports that he does not drink alcohol or use illicit drugs.  Allergies: No Known Allergies  Medications Prior to Admission  Medication Sig Dispense Refill  . Blood Glucose Monitoring Suppl (ONE TOUCH ULTRA 2) W/DEVICE KIT Use as directed 1 each 0  . glipiZIDE (GLUCOTROL) 10 MG tablet Take 1 tablet (10 mg total) by mouth 2 (two) times daily before a meal. 60 tablet 11  . glucose blood (ONE TOUCH ULTRA TEST) test strip  Use as instructed twice per day = type 2 DM uncontrolled 200 each 12  . Melatonin 10 MG CAPS Take 10 mg by mouth.    . metFORMIN (GLUCOPHAGE-XR) 500 MG 24 hr tablet Take 2 tablets (1,000 mg total) by mouth daily with breakfast. 120 tablet 11    ROS: History obtained from the patient  General ROS: negative for - chills, fatigue, fever, night sweats, weight gain or weight loss Psychological ROS: negative for - behavioral disorder, hallucinations, memory difficulties, mood swings or suicidal ideation Ophthalmic ROS: negative for - blurry vision, double vision, eye pain or loss of vision ENT ROS: negative for - epistaxis, nasal discharge, oral lesions, sore throat, tinnitus or vertigo Allergy and Immunology ROS: negative for - hives or itchy/watery eyes Hematological and Lymphatic ROS: negative for - bleeding problems, bruising or swollen lymph nodes Endocrine ROS: negative for - galactorrhea, hair pattern changes, polydipsia/polyuria or temperature intolerance Respiratory ROS: negative for - cough, hemoptysis, shortness of breath or wheezing Cardiovascular ROS: negative for - chest pain, dyspnea on exertion, edema or irregular heartbeat Gastrointestinal ROS: negative for - abdominal pain, diarrhea, hematemesis, nausea/vomiting or stool incontinence Genito-Urinary ROS: negative for - dysuria, hematuria, incontinence or urinary frequency/urgency Musculoskeletal ROS: negative for - joint swelling or muscular weakness Neurological ROS: as noted in HPI Dermatological ROS: negative for rash and skin lesion changes  Physical Examination: Blood pressure 134/80,  pulse 78, temperature 98.1 F (36.7 C), temperature source Oral, resp. rate 20, SpO2 100 %.  HEENT-  Normocephalic, no lesions, without obvious abnormality.  Normal external eye and conjunctiva.  Normal TM's bilaterally.  Normal auditory canals and external ears. Normal external nose, mucus membranes and septum.  Normal pharynx. Neck supple  with no masses, nodes, nodules or enlargement. Cardiovascular - regular rate and rhythm, S1, S2 normal, no murmur, click, rub or gallop Lungs - chest clear, no wheezing, rales, normal symmetric air entry Abdomen - soft, non-tender; bowel sounds normal; no masses,  no organomegaly Extremities - no joint deformities, effusion, or inflammation, no edema and no skin discoloration  Neurologic Examination: Mental Status: Alert, oriented, thought content appropriate.  Speech slightly slurred without evidence of aphasia. Able to follow commands without difficulty. Cranial Nerves: II-Visual fields were normal. III/IV/VI-Pupils were equal and reacted normally to light. Extraocular movements were full and conjugate.    V/VII-no facial numbness; mild right lower facial weakness. VIII-normal. X-mild dysarthria; symmetrical palatal movement. XI: trapezius strength/neck flexion strength normal bilaterally XII-midline tongue extension with normal strength. Motor: Mild drift of right upper extremity as well as mild weakness on manual testing proximally; mild proximal weakness of right lower extremity; motor exam otherwise unremarkable. Sensory: Normal throughout. Deep Tendon Reflexes: 1+ and symmetric. Plantars: Mute bilaterally Cerebellar: Slightly impaired finger-to-nose testing with use of right upper extremity. Carotid auscultation: Normal  No results found for this or any previous visit (from the past 48 hour(s)). No results found.  Assessment: 64 y.o. 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  Stroke Risk Factors - diabetes mellitus and hypertension  Plan: 1. HgbA1c, fasting lipid panel 2. MRI, MRA  of the brain without contrast 3. PT consult, OT consult, Speech consult 4. Echocardiogram 5. Carotid dopplers 6. Prophylactic therapy-Antiplatelet med: Aspirin 7. Risk factor modification 8. Telemetry  monitoring  C.R. Nicole Kindred, MD [facility] [phone]  08/02/2014, 7:44 PM

## 2014-08-03 ENCOUNTER — Inpatient Hospital Stay (HOSPITAL_COMMUNITY): Payer: Medicare Other

## 2014-08-03 DIAGNOSIS — I639 Cerebral infarction, unspecified: Secondary | ICD-10-CM

## 2014-08-03 DIAGNOSIS — E785 Hyperlipidemia, unspecified: Secondary | ICD-10-CM

## 2014-08-03 DIAGNOSIS — E1165 Type 2 diabetes mellitus with hyperglycemia: Secondary | ICD-10-CM

## 2014-08-03 DIAGNOSIS — I10 Essential (primary) hypertension: Secondary | ICD-10-CM

## 2014-08-03 DIAGNOSIS — R471 Dysarthria and anarthria: Secondary | ICD-10-CM

## 2014-08-03 LAB — LIPID PANEL
CHOLESTEROL: 237 mg/dL — AB (ref 0–200)
HDL: 50 mg/dL (ref >40)
LDL Cholesterol: 148 mg/dL — ABNORMAL HIGH (ref 0–99)
Total CHOL/HDL Ratio: 4.7 RATIO
Triglycerides: 193 mg/dL — ABNORMAL HIGH (ref <150)
VLDL: 39 mg/dL (ref 0–40)

## 2014-08-03 LAB — GLUCOSE, CAPILLARY
GLUCOSE-CAPILLARY: 245 mg/dL — AB (ref 65–99)
GLUCOSE-CAPILLARY: 193 mg/dL — AB (ref 65–99)
Glucose-Capillary: 189 mg/dL — ABNORMAL HIGH (ref 65–99)
Glucose-Capillary: 259 mg/dL — ABNORMAL HIGH (ref 65–99)

## 2014-08-03 LAB — HEMOGLOBIN A1C
Hgb A1c MFr Bld: 14.7 % — ABNORMAL HIGH (ref 4.8–5.6)
Mean Plasma Glucose: 375 mg/dL

## 2014-08-03 MED ORDER — INSULIN ASPART 100 UNIT/ML ~~LOC~~ SOLN
0.0000 [IU] | Freq: Three times a day (TID) | SUBCUTANEOUS | Status: DC
  Administered 2014-08-03: 8 [IU] via SUBCUTANEOUS
  Administered 2014-08-03: 3 [IU] via SUBCUTANEOUS
  Administered 2014-08-03: 5 [IU] via SUBCUTANEOUS
  Administered 2014-08-04: 8 [IU] via SUBCUTANEOUS
  Administered 2014-08-04 (×2): 3 [IU] via SUBCUTANEOUS
  Administered 2014-08-05: 2 [IU] via SUBCUTANEOUS
  Administered 2014-08-05: 5 [IU] via SUBCUTANEOUS

## 2014-08-03 MED ORDER — METFORMIN HCL ER 500 MG PO TB24
1000.0000 mg | ORAL_TABLET | Freq: Every day | ORAL | Status: DC
  Administered 2014-08-04 – 2014-08-05 (×2): 1000 mg via ORAL
  Filled 2014-08-03 (×2): qty 2

## 2014-08-03 MED ORDER — GLIPIZIDE 5 MG PO TABS
10.0000 mg | ORAL_TABLET | Freq: Two times a day (BID) | ORAL | Status: DC
  Administered 2014-08-03 – 2014-08-05 (×4): 10 mg via ORAL
  Filled 2014-08-03 (×5): qty 2

## 2014-08-03 NOTE — Progress Notes (Signed)
SLP Cancellation Note  Patient Details Name: Jake Chen MRN: [id number] DOB: [birth year]   Cancelled treatment:       Reason Eval/Treat Not Completed: Patient at procedure or test/unavailable.  SLP will follow up as able.   Fae Pippin, M.A., CCC-SLP [phone]  [name] 08/03/2014, 11:29 AM

## 2014-08-03 NOTE — Progress Notes (Signed)
STROKE TEAM PROGRESS NOTE   HISTORY Jake Chen is an 64 y.o. male with a history of diabetes mellitus and hypertension presenting with new onset right hemiparesis and slurred speech. Symptoms were present 21 this morning. He was last known well at midnight last night. He has no previous history of stroke nor TIA. He has not been taking aspirin on a regular basis. CT scan of his head reportedly was unremarkable with no acute intracranial abnormality. MRI studies pending. NIH stroke score was 4.  LSN: 12:00 AM on 08/02/2014 tPA Given: No: Beyond time under for treatment consideration mRankin:   SUBJECTIVE (INTERVAL HISTORY) The patient's daughter present. The patient still has slurred speech. Dr. Pearlean Brownie discussed stroke risk factors in detail. The patient's main deficit at this time appears to be fine motor movement of the hand. The patient had not been compliant with his diet or medications.   OBJECTIVE Temp:  [97.4 F (36.3 C)-98.3 F (36.8 C)] 97.6 F (36.4 C) (07/26 1100) Pulse Rate:  [66-87] 74 (07/26 1100) Cardiac Rhythm:  [-]  Resp:  [16-20] 18 (07/26 1100) BP: (124-163)/(71-101) 140/71 mmHg (07/26 1100) SpO2:  [97 %-100 %] 100 % (07/26 1100)   Recent Labs Lab 08/02/14 2214 08/03/14 0628 08/03/14 1214  GLUCAP 288* 189* 245*    Recent Labs Lab 08/02/14 2021  CREATININE 0.76   No results for input(s): AST, ALT, ALKPHOS, BILITOT, PROT, ALBUMIN in the last 168 hours.  Recent Labs Lab 08/02/14 2021  WBC 4.4  HGB 14.2  HCT 42.6  MCV 84.7  PLT 232   No results for input(s): CKTOTAL, CKMB, CKMBINDEX, TROPONINI in the last 168 hours. No results for input(s): LABPROT, INR in the last 72 hours. No results for input(s): COLORURINE, LABSPEC, PHURINE, GLUCOSEU, HGBUR, BILIRUBINUR, KETONESUR, PROTEINUR, UROBILINOGEN, NITRITE, LEUKOCYTESUR in the last 72 hours.  Invalid input(s): APPERANCEUR     Component Value Date/Time   CHOL 237* 08/03/2014 0613   TRIG 193*  08/03/2014 0613   HDL 50 08/03/2014 0613   CHOLHDL 4.7 08/03/2014 0613   VLDL 39 08/03/2014 0613   LDLCALC 148* 08/03/2014 0613   Lab Results  Component Value Date   HGBA1C 14.7* 08/02/2014   No results found for: LABOPIA, COCAINSCRNUR, LABBENZ, AMPHETMU, THCU, LABBARB  No results for input(s): ETH in the last 168 hours.    Imaging  Dg Chest 2 View 08/02/2014    No active cardiopulmonary disease.     Mr Brain Wo Contrast 08/02/2014     MRI HEAD   1. Acute ischemic nonhemorrhagic infarct involving the left lentiform nucleus with superior extension into the periventricular white matter of the left corona radiata.  2. Remote lacunar type infarction within the right basal ganglia/corona radiata.  3. Mild chronic microvascular ischemic disease.     MRA HEAD   Normal MRA of the intracranial circulation.  No early or proximal arterial branch occlusion identified. No high grade or correctable stenosis.       PHYSICAL EXAM Pleasant middle-aged African-American male currently not in distress. . Afebrile. Head is nontraumatic. Neck is supple without bruit.    Cardiac exam no murmur or gallop. Lungs are clear to auscultation. Distal pulses are well felt. Neurological Exam :  Awake alert oriented 3 with normal speech and language function except mild dysarthria. Pupils are equal reactive to light and accommodation. Fundi were not visualized. Vision acuity and fields seem normal. Minimum right lower facial asymmetry when he smiles. Tongue midline. Motor system exam reveals no upper  or lower extremity drift. Mild weakness of right grip and intrinsic hand muscles. Orbits left or right approximately. Symmetric lower extremity strength. Deep tendon reflexes are symmetric. Plantars are downgoing. Sensation is intact. Gait was not tested.  NIH stroke scale score 2   ASSESSMENT/PLAN Mr. Jake Chen is a 64 y.o. male with history of diabetes mellitus, hyperlipidemia, and hypertension,  presenting with right hemiparesis and dysarthria.Marland Kitchen He did not receive IV t-PA due to late presentation.   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   Carotid Doppler - Bilateral: 1-39% ICA stenosis. Vertebral artery flow is antegrade.   2D Echo pending  LDL 148  HgbA1c 14.7  Subcutaneous heparin for VTE prophylaxis Diet Carb Modified Fluid consistency:: Thin; Room service appropriate?: Yes  no antithrombotic prior to admission, now on aspirin 325 mg orally every day  Patient counseled to be compliant with his antithrombotic medications  Ongoing aggressive stroke risk factor management  Therapy recommendations: Home health occupational therapy. Physical therapy evaluation is pending.  Disposition: Pending  Hypertension  Home meds: No antihypertensives medications prior to admission.  Stable  Permissive hypertension <220/120 for 24-48 hours and then gradually normalize within 5-7 days  Patient counseled to be compliant with his blood pressure medications  Hyperlipidemia  Home meds:  No lipid lowering medications prior to admission  LDL 148, goal < 70  Now on Lipitor 40 mg daily  Continue statin at discharge   Diabetes  HgbA1c 14.7, goal < 7.0  Uncontrolled    Other Stroke Risk Factors  Advanced age  Hx stroke/TIA  (remote lacunar stroke by MRI)   Other Active Problems    Other Pertinent History    Hospital day # 1  Delton See PA-C [facility] Pager [phone] 08/03/2014, 1:01 PM I have personally examined this patient, reviewed notes, independently viewed imaging studies, participated in medical decision making and plan of care. I have made any additions or clarifications directly to the above note. Agree with note above. He presented  with right hemiparesis secondary to small left subcortical infarct etiology likely small vessel disease. He remains at risk for neurological worsening, recurrent stroke, TIAs and needs ongoing stroke evaluation and aggressive risk factor modification. Start aspirin and Lipitor for stroke prevention  Delia Heady, MD Medical Director [facility] Pager: [phone] 08/03/2014 1:33 PM     To contact Stroke Continuity provider, please refer to [email or web address]. After hours, contact General Neurology

## 2014-08-03 NOTE — Progress Notes (Signed)
PROGRESS NOTE  Jake Chen BJY:[id number] DOB: Oct 14, 1950 DOA: 08/02/2014 PCP: [name], MD  Assessment/Plan: CVA (cerebral infarction) HgbA1c- 14, fasting lipid panel: 148 MRI, MRA of the brain +  For CVA PT consult, OT consult, Speech consult  Echocardiogram  Carotid dopplers  Prophylactic therapy-Antiplatelet med: Aspirin - dose 325 mg PO daily  Cardiac Monitoring  Neurochecks q4h  Keep MAP 70  Hyperlipidemia: - statins started.  Essential hypertension Allow permisive HTN.  Uncontrolled diabetes mellitus without complication: -SSI, does not appear to be taking metformin/glucotrol  Code Status: full Family Communication: son on phone Disposition Plan:    Consultants: Neuro  Procedures:      HPI/Subjective: Doing well- eating ok  Objective: Filed Vitals:   08/03/14 0449  BP: 151/86  Pulse: 66  Temp: 97.5 F (36.4 C)  Resp: 18    Intake/Output Summary (Last 24 hours) at 08/03/14 1053 Last data filed at 08/03/14 0900  Gross per 24 hour  Intake    360 ml  Output      0 ml  Net    360 ml   There were no vitals filed for this visit.  Exam:   General: A+Ox3, NAD  Cardiovascular: rrr  Respiratory: clear  Abdomen: +BS, soft  Musculoskeletal: no edema   Data Reviewed: Basic Metabolic Panel:  Recent Labs Lab 08/02/14 2021  CREATININE 0.76   Liver Function Tests: No results for input(s): AST, ALT, ALKPHOS, BILITOT, PROT, ALBUMIN in the last 168 hours. No results for input(s): LIPASE, AMYLASE in the last 168 hours. No results for input(s): AMMONIA in the last 168 hours. CBC:  Recent Labs Lab 08/02/14 2021  WBC 4.4  HGB 14.2  HCT 42.6  MCV 84.7  PLT 232   Cardiac Enzymes: No results for input(s): CKTOTAL, CKMB, CKMBINDEX, TROPONINI in the last 168 hours. BNP (last 3 results) No results for input(s): BNP in the last 8760 hours.  ProBNP (last 3 results) No results for input(s): PROBNP in the last 8760  hours.  CBG:  Recent Labs Lab 08/02/14 2214 08/03/14 0628  GLUCAP 288* 189*    No results found for this or any previous visit (from the past 240 hour(s)).   Studies: Dg Chest 2 View  08/02/2014   CLINICAL DATA:  Dysarthria  EXAM: CHEST  2 VIEW  COMPARISON:  03/12/2014  FINDINGS: Lungs are hyper aerated and clear. Normal heart size. No pneumothorax.  IMPRESSION: No active cardiopulmonary disease.   Electronically Signed   By: Jolaine Click M.D.   On: 08/02/2014 22:37   Mr Brain Wo Contrast  08/02/2014   CLINICAL DATA:  Initial evaluation for acute dysarthria.  EXAM: MRI HEAD WITHOUT CONTRAST  MRA HEAD WITHOUT CONTRAST  TECHNIQUE: Multiplanar, multiecho pulse sequences of the brain and surrounding structures were obtained without intravenous contrast. Angiographic images of the head were obtained using MRA technique without contrast.  COMPARISON:  None.  FINDINGS: MRI HEAD FINDINGS  Curvilinear focus of restricted diffusion measuring 13 x 4 x 14 mm present within the left lentiform nucleus with superior extension into the periventricular white matter of the left corona radiata, compatible with acute ischemic nonhemorrhagic infarct. No associated mass effect or hemorrhage. No other acute ischemic infarct. Gray-white matter differentiation maintained. Normal intravascular flow voids are preserved. No acute or chronic intracranial hemorrhage.  No acute or chronic intracranial hemorrhage.  Small remote lacunar infarct present within the right basal ganglia/right corona radiata. Mild chronic small vessel ischemic changes present within the periventricular white matter.  Mild age-related cerebral atrophy.  No mass lesion, midline shift, or mass effect. No hydrocephalus. No extra-axial fluid collection.  Craniocervical junction within normal limits. Pituitary gland normal. No acute abnormality about the orbits.  Minimal mucosal thickening within the ethmoidal air cells. Paranasal sinuses are otherwise clear.  No mastoid effusion. Inner ear structures grossly normal.  Bone marrow signal intensity within normal limits. Scalp soft tissues unremarkable.  MRA HEAD FINDINGS  ANTERIOR CIRCULATION:  Visualized distal cervical segments of the internal carotid arteries are widely patent with antegrade flow. The petrous segments are widely patent. Minimal atheromatous irregularity within the cavernous right ICA without significant stenosis. Cavernous left ICA widely patent. Supraclinoid segments widely patent. A1 segments, anterior communicating artery, anterior cerebral arteries widely patent. M1 segments widely patent without stenosis or occlusion. MCA bifurcations within normal limits. Distal MCA branches symmetric and well opacified bilaterally.  POSTERIOR CIRCULATION:  Vertebral arteries are widely patent to the vertebrobasilar junction. Posterior inferior cerebral arteries patent bilaterally. Basilar artery well opacified and widely patent. Right anterior inferior cerebral artery is dominant. Superior cerebellar arteries patent bilaterally. Both posterior cerebral arteries arise from the basilar artery and are well opacified without stenosis or occlusion.  No aneurysm or vascular malformation.  IMPRESSION: MRI HEAD IMPRESSION:  1. Acute ischemic nonhemorrhagic infarct involving the left lentiform nucleus with superior extension into the periventricular white matter of the left corona radiata. 2. Remote lacunar type infarction within the right basal ganglia/corona radiata. 3. Mild chronic microvascular ischemic disease.  MRA HEAD IMPRESSION:  Normal MRA of the intracranial circulation. No early or proximal arterial branch occlusion identified. No high grade or correctable stenosis.   Electronically Signed   By: Rise Mu M.D.   On: 08/02/2014 22:22   Mr Maxine Glenn Head/brain Wo Cm  08/02/2014   CLINICAL DATA:  Initial evaluation for acute dysarthria.  EXAM: MRI HEAD WITHOUT CONTRAST  MRA HEAD WITHOUT CONTRAST   TECHNIQUE: Multiplanar, multiecho pulse sequences of the brain and surrounding structures were obtained without intravenous contrast. Angiographic images of the head were obtained using MRA technique without contrast.  COMPARISON:  None.  FINDINGS: MRI HEAD FINDINGS  Curvilinear focus of restricted diffusion measuring 13 x 4 x 14 mm present within the left lentiform nucleus with superior extension into the periventricular white matter of the left corona radiata, compatible with acute ischemic nonhemorrhagic infarct. No associated mass effect or hemorrhage. No other acute ischemic infarct. Gray-white matter differentiation maintained. Normal intravascular flow voids are preserved. No acute or chronic intracranial hemorrhage.  No acute or chronic intracranial hemorrhage.  Small remote lacunar infarct present within the right basal ganglia/right corona radiata. Mild chronic small vessel ischemic changes present within the periventricular white matter. Mild age-related cerebral atrophy.  No mass lesion, midline shift, or mass effect. No hydrocephalus. No extra-axial fluid collection.  Craniocervical junction within normal limits. Pituitary gland normal. No acute abnormality about the orbits.  Minimal mucosal thickening within the ethmoidal air cells. Paranasal sinuses are otherwise clear. No mastoid effusion. Inner ear structures grossly normal.  Bone marrow signal intensity within normal limits. Scalp soft tissues unremarkable.  MRA HEAD FINDINGS  ANTERIOR CIRCULATION:  Visualized distal cervical segments of the internal carotid arteries are widely patent with antegrade flow. The petrous segments are widely patent. Minimal atheromatous irregularity within the cavernous right ICA without significant stenosis. Cavernous left ICA widely patent. Supraclinoid segments widely patent. A1 segments, anterior communicating artery, anterior cerebral arteries widely patent. M1 segments widely patent without stenosis or occlusion.  MCA  bifurcations within normal limits. Distal MCA branches symmetric and well opacified bilaterally.  POSTERIOR CIRCULATION:  Vertebral arteries are widely patent to the vertebrobasilar junction. Posterior inferior cerebral arteries patent bilaterally. Basilar artery well opacified and widely patent. Right anterior inferior cerebral artery is dominant. Superior cerebellar arteries patent bilaterally. Both posterior cerebral arteries arise from the basilar artery and are well opacified without stenosis or occlusion.  No aneurysm or vascular malformation.  IMPRESSION: MRI HEAD IMPRESSION:  1. Acute ischemic nonhemorrhagic infarct involving the left lentiform nucleus with superior extension into the periventricular white matter of the left corona radiata. 2. Remote lacunar type infarction within the right basal ganglia/corona radiata. 3. Mild chronic microvascular ischemic disease.  MRA HEAD IMPRESSION:  Normal MRA of the intracranial circulation. No early or proximal arterial branch occlusion identified. No high grade or correctable stenosis.   Electronically Signed   By: Rise Mu M.D.   On: 08/02/2014 22:22    Scheduled Meds: . aspirin  300 mg Rectal Daily   Or  . aspirin  325 mg Oral Daily  . atorvastatin  40 mg Oral q1800  . glipiZIDE  10 mg Oral BID AC  . heparin  5,000 Units Subcutaneous 3 times per day  . insulin aspart  0-15 Units Subcutaneous TID WC  . insulin detemir  5 Units Subcutaneous QHS  . [START ON 08/04/2014] metFORMIN  1,000 mg Oral Q breakfast   Continuous Infusions: . sodium chloride 75 mL/hr at 08/02/14 1959   Antibiotics Given (last 72 hours)    None      Principal Problem:   CVA (cerebral infarction) Active Problems:   Hyperlipidemia   Essential hypertension   Uncontrolled diabetes mellitus without complication    Time spent: 25 min    Benjamine Mola Alek Borges  [facility] Pager [phone]  If 7PM-7AM, please contact night-coverage at [email or web address],  password [facility] 08/03/2014, 10:53 AM  LOS: 1 day

## 2014-08-03 NOTE — Evaluation (Signed)
Occupational Therapy Evaluation Patient Details Name: AVORY MIMBS MRN: [id number] DOB: 08-02-1950 Today's Date: 08/03/2014    History of Present Illness 64 y.o. male with history of DM, HTN presenting with new onset of R hemiparesis and slurred speech. NIH stroke score was 4   Clinical Impression   Pt eager to get out of bed and participate in therapy. Pt able to follow commands, and denies sensation or vision deficits. Pt able to complete ADLs with supervision but occasionally lean to R side or bumps into objects on R side, Pt is able to self-correct. Daughter reports balance deficits not new. Pt will benefit from acute OT for increased safety and independence with ADLs prior to d/c home with HHOT.    Follow Up Recommendations  Home health OT;Supervision - Intermittent    Equipment Recommendations  None recommended by OT    Recommendations for Other Services       Precautions / Restrictions Precautions Precautions: Fall Restrictions Weight Bearing Restrictions: No      Mobility Bed Mobility Overal bed mobility: Modified Independent                Transfers Overall transfer level: Modified independent Equipment used: None                  Balance Overall balance assessment: Needs assistance Sitting-balance support: Feet supported;No upper extremity supported Sitting balance-Leahy Scale: Good     Standing balance support: Single extremity supported;During functional activity Standing balance-Leahy Scale: Fair Standing balance comment: Pt bumping into items on R side                            ADL Overall ADL's : Needs assistance/impaired Eating/Feeding: Independent;Sitting   Grooming: Wash/dry hands;Wash/dry face;Oral care;Supervision/safety;Standing   Upper Body Bathing: Supervision/ safety;Sitting   Lower Body Bathing: Supervison/ safety;Sit to/from stand   Upper Body Dressing : Supervision/safety;Sitting   Lower Body  Dressing: Supervision/safety;Sit to/from stand   Toilet Transfer: Min guard;Ambulation;Regular Toilet   Toileting- Clothing Manipulation and Hygiene: Modified independent;Sit to/from stand       Functional mobility during ADLs: Min guard       Vision Vision Assessment?: Yes Eye Alignment: Within Functional Limits Ocular Range of Motion: Within Functional Limits Alignment/Gaze Preference: Within Defined Limits Tracking/Visual Pursuits: Able to track stimulus in all quads without difficulty Saccades: Within functional limits Convergence: Impaired - to be further tested in functional context   Perception     Praxis      Pertinent Vitals/Pain Pain Assessment: No/denies pain     Hand Dominance Left   Extremity/Trunk Assessment Upper Extremity Assessment Upper Extremity Assessment: Generalized weakness;Overall [facility] for tasks assessed   Lower Extremity Assessment Lower Extremity Assessment: Defer to PT evaluation       Communication Communication Communication: Expressive difficulties   Cognition Arousal/Alertness: Awake/alert Behavior During Therapy: WFL for tasks assessed/performed Overall Cognitive Status: Within Functional Limits for tasks assessed                     General Comments       Exercises       Shoulder Instructions      Home Living Family/patient expects to be discharged to:: Private residence Living Arrangements: Alone Available Help at Discharge: Family;Friend(s);Available PRN/intermittently;Other (Comment) Therapist, music at apt complex 24/7) Type of Home: Apartment Home Access: Stairs to enter [employer] of Steps: Pt lives in 3rd floor condo, no elevators Entrance Stairs-Rails:  Right Home Layout: Two level;Able to live on main level with bedroom/bathroom     Bathroom Shower/Tub: Tub/shower unit Shower/tub characteristics: Engineer, building services: Standard     Home Equipment: None          Prior  Functioning/Environment Level of Independence: Independent             OT Diagnosis: Generalized weakness   OT Problem List: Decreased strength;Decreased activity tolerance;Impaired balance (sitting and/or standing);Decreased safety awareness   OT Treatment/Interventions: Self-care/ADL training;DME and/or AE instruction;Therapeutic activities;Therapeutic exercise;Patient/family education;Balance training    OT Goals(Current goals can be found in the care plan section) Acute Rehab OT Goals Patient Stated Goal: to "get moving" OT Goal Formulation: With patient Time For Goal Achievement: 08/17/14 Potential to Achieve Goals: Good ADL Goals Pt Will Perform Upper Body Bathing: with supervision;sitting Pt Will Perform Lower Body Bathing: with supervision;sitting/lateral leans Pt Will Perform Tub/Shower Transfer: Tub transfer;with supervision;ambulating  OT Frequency: Min 3X/week   Barriers to D/C:            Co-evaluation              End of Session Equipment Utilized During Treatment: Gait belt Nurse Communication: Mobility status  Activity Tolerance: Patient tolerated treatment well Patient left: in chair;with call bell/phone within reach;with chair alarm set;with family/visitor present   Time: 1610-9604 OT Time Calculation (min): 32 min Charges:  OT General Charges $OT Visit: 1 Procedure OT Evaluation $Initial OT Evaluation Tier I: 1 Procedure OT Treatments $Self Care/Home Management : 8-22 mins G-Codes:    Marden Noble 08/03/2014, 11:54 AM

## 2014-08-03 NOTE — Evaluation (Addendum)
Physical Therapy Evaluation Patient Details Name: Jake Chen MRN: [id number] DOB: [birth year] Today's Date: 08/03/2014   History of Present Illness  64 y.o. male with history of DM, HTN presenting with new onset of R hemiparesis and slurred speech. NIH stroke score was 4  Clinical Impression  Patient presents with problems listed below.  Will benefit from acute PT to maximize functional independence prior to discharge.  Patient with decreased balance impacting gait/safety.  Recommend use of RW and f/u HHPT for continued therapy at discharge.  May need increased supervision if balance does not continue to improve.    Follow Up Recommendations Home health PT;Supervision - Intermittent    Equipment Recommendations  Rolling walker with 5" wheels    Recommendations for Other Services       Precautions / Restrictions Precautions Precautions: Fall Restrictions Weight Bearing Restrictions: No      Mobility  Bed Mobility               General bed mobility comments: Patient in chair as PT entered room  Transfers Overall transfer level: Modified independent Equipment used: None             General transfer comment: Fair balance in stance.  Ambulation/Gait Ambulation/Gait assistance: Min guard;Min assist Ambulation Distance (Feet): 200 Feet Assistive device: None Gait Pattern/deviations: Step-through pattern;Decreased stride length;Decreased stance time - right;Decreased step length - left;Decreased weight shift to right;Staggering right;Staggering left Gait velocity: Decreased Gait velocity interpretation: Below normal speed for age/gender General Gait Details: Patient with unsteady gait, staggering to both sides.  Decreased balance with head turns and during turns, requiring assist to prevent loss of balance.  Noted patient with decreased stability/limp on RLE - reports this is chronic issue.  Stairs            Wheelchair Mobility    Modified Rankin  (Stroke Patients Only) Modified Rankin (Stroke Patients Only) Pre-Morbid Rankin Score: No significant disability (RLE issues impacting gait) Modified Rankin: Moderately severe disability     Balance           Standing balance support: No upper extremity supported Standing balance-Leahy Scale: Fair Standing balance comment: Able to maintain static standing balance.  Decreased balance with dynamic activities including gait.                             Pertinent Vitals/Pain Pain Assessment: No/denies pain    Home Living Family/patient expects to be discharged to:: Private residence Living Arrangements: Alone Available Help at Discharge: Family;Friend(s);Available PRN/intermittently (Son local; Daughter in [place]) Type of Home: Apartment Home Access: Stairs to enter Entrance Stairs-Rails: Right Entrance Stairs-Number of Steps: Pt lives in 3rd floor condo, no elevators Home Layout: Two level;Able to live on main level with bedroom/bathroom Home Equipment: None      Prior Function Level of Independence: Independent               Hand Dominance   Dominant Hand: Left    Extremity/Trunk Assessment   Upper Extremity Assessment: RUE deficits/detail RUE Deficits / Details: Patient reports hand "not right yet".  Noted decreased fine motor control         Lower Extremity Assessment: Overall WFL for tasks assessed;RLE deficits/detail RLE Deficits / Details: Decreased coordination with heel-to-shin; strength symmetrical       Communication   Communication: Expressive difficulties  Cognition Arousal/Alertness: Awake/alert Behavior During Therapy: WFL for tasks assessed/performed Overall Cognitive Status: Within Functional Limits for  tasks assessed                      General Comments      Exercises        Assessment/Plan    PT Assessment Patient needs continued PT services  PT Diagnosis Difficulty walking;Abnormality of gait;Hemiplegia  non-dominant side   PT Problem List Decreased strength;Decreased balance;Decreased mobility;Decreased coordination;Decreased knowledge of use of DME  PT Treatment Interventions DME instruction;Gait training;Stair training;Functional mobility training;Therapeutic activities;Balance training;Patient/family education   PT Goals (Current goals can be found in the Care Plan section) Acute Rehab PT Goals Patient Stated Goal: To return home PT Goal Formulation: With patient Time For Goal Achievement: 08/10/14 Potential to Achieve Goals: Good    Frequency Min 4X/week   Barriers to discharge Decreased caregiver support;Inaccessible home environment Patient with 3 flights of stairs to enter apartment.  Patient lives alone.    Co-evaluation               End of Session Equipment Utilized During Treatment: Gait belt Activity Tolerance: Patient tolerated treatment well Patient left: in chair;with call bell/phone within reach;with chair alarm set Nurse Communication: Mobility status         Time: 1320-1340 PT Time Calculation (min) (ACUTE ONLY): 20 min   Charges:   PT Evaluation $Initial PT Evaluation Tier I: 1 Procedure     PT G CodesVena Austria 2014/08/16, 5:19 PM Durenda Hurt. Renaldo Fiddler, [facility] Acute Rehab Services Pager [phone]

## 2014-08-03 NOTE — Progress Notes (Signed)
Family is very concerned about the recommendation of home with home health, according to family, the patient lives in [place] and no family lives near or will be able to assist him on a regular basis, his condo is lives in a second floor walk up with no elevator and he lives alone, they don't feel comfortable sending him to [place] to live by himself with his deficits at this time and would prefer an inpatient rehab here or at an outside facility before returning him to [place].

## 2014-08-03 NOTE — Progress Notes (Signed)
*  PRELIMINARY RESULTS* Vascular Ultrasound Carotid Duplex (Doppler) has been completed.  Preliminary findings: Bilateral:  1-39% ICA stenosis.  Vertebral artery flow is antegrade.      Farrel Demark, RDMS, RVT  08/03/2014, 11:23 AM

## 2014-08-03 NOTE — Progress Notes (Signed)
Inpatient Diabetes Program Recommendations  AACE/ADA: New Consensus Statement on Inpatient Glycemic Control (2013)  Target Ranges:  Prepandial:   less than 140 mg/dL      Peak postprandial:   less than 180 mg/dL (1-2 hours)      Critically ill patients:  140 - 180 mg/dL   Results for Jake Chen, Jake Chen (MRN [id number]) as of 08/03/2014 12:20  Ref. Range 08/03/2014 06:28 08/03/2014 12:14  Glucose-Capillary Latest Ref Range: 65-99 mg/dL 829 (H) 562 (H)   Results for Jake Chen, Jake Chen (MRN [id number]) as of 08/03/2014 12:20  Ref. Range 03/09/2014 16:57 08/02/2014 20:21  Hemoglobin A1C Latest Ref Range: 4.8-5.6 % 13.1 (H) 14.7 (H)     Admit with: CVA  History: DM, HTN  Home DM Meds: Glipizide 10 mg bid       Metformin 1000 mg daily  Current DM Orders: Levemir 5 units QHS            Novolog Moderate SSI (0-15 units) TID AC            Glipizide 10 mg bid            Metformin 1000 mg daily    -A1c shows extremely poor blood glucose control at home.  Likely needs insulin as an outpatient.    -Note patient saw his PCP: Dr. [name] with [facility] on 03/09/14.  Per MD notes, patient was not taking oral DM medications as prescribed.  MD note also stated that patient did not want to start insulin at home.    MD- Please consider the following in-hospital insulin adjustments:  1. Increase Levemir to 10 units QHS  2. Add Novolog Meal Coverage- Novolog 3 units tidwc (per Glycemic Control Order set)    Will follow Ambrose Finland RN, MSN, CDE Diabetes Coordinator Inpatient Glycemic Control Team Team Pager: [phone] (8a-5p)

## 2014-08-03 NOTE — Progress Notes (Signed)
Utilization Review Completed.Jake Chen T7/26/2016  

## 2014-08-04 ENCOUNTER — Inpatient Hospital Stay (HOSPITAL_COMMUNITY): Payer: Medicare Other

## 2014-08-04 DIAGNOSIS — I6339 Cerebral infarction due to thrombosis of other cerebral artery: Secondary | ICD-10-CM

## 2014-08-04 DIAGNOSIS — I639 Cerebral infarction, unspecified: Principal | ICD-10-CM

## 2014-08-04 DIAGNOSIS — I6789 Other cerebrovascular disease: Secondary | ICD-10-CM

## 2014-08-04 DIAGNOSIS — R471 Dysarthria and anarthria: Secondary | ICD-10-CM | POA: Insufficient documentation

## 2014-08-04 LAB — GLUCOSE, CAPILLARY
GLUCOSE-CAPILLARY: 192 mg/dL — AB (ref 65–99)
GLUCOSE-CAPILLARY: 271 mg/dL — AB (ref 65–99)
Glucose-Capillary: 169 mg/dL — ABNORMAL HIGH (ref 65–99)
Glucose-Capillary: 171 mg/dL — ABNORMAL HIGH (ref 65–99)

## 2014-08-04 LAB — HEMOGLOBIN A1C
Hgb A1c MFr Bld: 15.4 % — ABNORMAL HIGH (ref 4.8–5.6)
Mean Plasma Glucose: 395 mg/dL

## 2014-08-04 MED ORDER — INSULIN DETEMIR 100 UNIT/ML ~~LOC~~ SOLN
10.0000 [IU] | Freq: Every day | SUBCUTANEOUS | Status: DC
  Administered 2014-08-04: 10 [IU] via SUBCUTANEOUS
  Filled 2014-08-04 (×2): qty 0.1

## 2014-08-04 MED ORDER — PERFLUTREN LIPID MICROSPHERE
1.0000 mL | INTRAVENOUS | Status: AC | PRN
  Filled 2014-08-04: qty 10

## 2014-08-04 MED ORDER — PERFLUTREN LIPID MICROSPHERE
INTRAVENOUS | Status: AC
  Administered 2014-08-04: 14:00:00
  Filled 2014-08-04: qty 10

## 2014-08-04 NOTE — Evaluation (Signed)
Speech Language Pathology Evaluation Patient Details Name: Jake Chen MRN: [id number] DOB: 02-06-1950 Today's Date: 08/04/2014 Time: 4098-1191 SLP Time Calculation (min) (ACUTE ONLY): 28 min  Problem List:  Patient Active Problem List   Diagnosis Date Noted  . CVA (cerebral infarction) 08/02/2014  . Uncontrolled diabetes mellitus without complication 03/09/2014  . Anxiety and depression 07/19/2011  . Preventative health care 01/26/2011  . Depression 01/26/2011  . Erectile dysfunction 01/26/2011  . 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 WEIGHT LOSS 06/24/2007  . PSA, INCREASED 12/25/2006  . Hyperlipidemia 12/24/2006  . HYPERLIPIDEMIA 12/24/2006  . ANXIETY 12/24/2006  . DEPRESSION 12/24/2006  . Essential hypertension 12/24/2006   Past Medical History:  Past Medical History  Diagnosis Date  . Depression 01/26/2011  . Erectile dysfunction 01/26/2011  . 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 Anxiety and depression 07/19/2011  . DIABETES MELLITUS, TYPE II 12/24/2006    Qualifier: Diagnosis of  By: Jonny Ruiz MD, Len Blalock   . HYPERLIPIDEMIA 12/24/2006    Qualifier: Diagnosis of  By: Jonny Ruiz MD, Len Blalock   . HYPERTENSION 12/24/2006    Qualifier: Diagnosis of  By: Jonny Ruiz MD, Len Blalock   . PSA, INCREASED 12/25/2006    Qualifier: Diagnosis of  By: Jonny Ruiz MD, Len Blalock   . Diabetes 03/09/2014   Past Surgical History: History reviewed. No pertinent past surgical history. HPI:  Jake Chen is a 64 y.o. male with past medical history of hypertension and diabetes. MRI revealed an acute ischemic nonhemorrhagic infarct involving the left.  Orders recieved for Speech-language evaluation.   Assessment / Plan / Recommendation Clinical Impression  Cognitive-linguistic evaluation complete.  Patient demonstrates mild dysarthria due to mild right-sided lingual weakness resulting in imprecise consonant productions  at the sentence level of verbal expression.  Patient also demonstrates moderate cognitive impairments characterized by impaired selective attention, recall of new/complex information and intellectual awareness of deficits (such as balance), which impacts his ability to safely complete home managment tasks.  As a result, 24/7 supervision is recommended ans patient will require follow up SLP services.       SLP Assessment  Patient needs continued Speech Lanaguage Pathology Services    Follow Up Recommendations  24 hour supervision/assistance;Inpatient Rehab    Frequency and Duration min 2x/week  1 week   Pertinent Vitals/Pain Pain Assessment: No/denies pain   SLP Goals  Potential to Achieve Goals (ACUTE ONLY): Good Potential Considerations (ACUTE ONLY): Ability to learn/carryover information;Family/community support  SLP Evaluation Prior Functioning  Cognitive/Linguistic Baseline: Information not available Type of Home: Apartment  Lives With: Alone Available Help at Discharge: Family;Friend(s);Available PRN/intermittently   Cognition  Overall Cognitive Status: Impaired/Different from baseline Orientation Level: Oriented X4 Attention: Selective Selective Attention: Impaired Memory: Impaired Memory Impairment: Storage deficit;Retrieval deficit;Decreased recall of new information Awareness: Impaired Awareness Impairment: Intellectual impairment Problem Solving: Impaired Problem Solving Impairment: Verbal complex;Functional complex Executive Function: Reasoning;Decision Making;Self Monitoring;Self Correcting Reasoning: Impaired Reasoning Impairment: Verbal complex Decision Making: Impaired Decision Making Impairment: Verbal complex Self Monitoring: Impaired Self Monitoring Impairment: Verbal complex;Functional complex Self Correcting: Impaired Self Correcting Impairment: Verbal complex;Functional complex Behaviors: Impulsive Safety/Judgment: Impaired    Comprehension   Auditory Comprehension Overall Auditory Comprehension: Impaired Conversation: Complex Interfering Components: Hearing EffectiveTechniques: Increased volume;Repetition Visual Recognition/Discrimination Discrimination: Within Function Limits Reading Comprehension Reading Status: Impaired Paragraph Level: Impaired (impacted by working memory) Water quality scientist (signs, name badge): Within  functional limits    Expression Expression Primary Mode of Expression: Verbal Verbal Expression Overall Verbal Expression: Appears within functional limits for tasks assessed Written Expression Dominant Hand: Left Written Expression: Within Functional Limits   Oral / Motor Oral Motor/Sensory Function Overall Oral Motor/Sensory Function: Impaired Labial ROM: Within Functional Limits Labial Symmetry: Within Functional Limits Labial Strength: Within Functional Limits Labial Sensation: Within Functional Limits Lingual ROM: Within Functional Limits Lingual Symmetry: Within Functional Limits Lingual Strength: Reduced Lingual Sensation: Within Functional Limits Facial ROM: Within Functional Limits Facial Symmetry: Within Functional Limits Facial Strength: Within Functional Limits Facial Sensation: Within Functional Limits Velum: Within Functional Limits Mandible: Within Functional Limits Motor Speech Overall Motor Speech: Impaired Respiration: Within functional limits Phonation: Normal Resonance: Within functional limits Articulation: Impaired Level of Impairment: Sentence Intelligibility: Intelligibility reduced Sentence: 75-100% accurate Conversation: 50-74% accurate Motor Planning: Witnin functional limits Interfering Components: Hearing loss Effective Techniques: Slow rate;Over-articulate   GO    Charlane Ferretti., CCC-SLP [phone]  Kolleen Ochsner 08/04/2014, 10:30 AM

## 2014-08-04 NOTE — Progress Notes (Signed)
Rehab Admissions Coordinator Note:  Patient was screened by Trish Mage for appropriateness for an Inpatient Acute Rehab Consult.  At this time, an inpatient rehab consult has been ordered.  We will follow up once patient is seen by PA/MD.  Lelon Frohlich M 08/04/2014, 10:38 AM  I can be reached at [phone].

## 2014-08-04 NOTE — Progress Notes (Signed)
Rehab admissions - Please see rehab progress note by Dr. Wynn Banker.  Patient is doing too well for acute inpatient rehab admission.  Call me for questions.  #[phone]

## 2014-08-04 NOTE — Progress Notes (Signed)
Thanks for consult.  Review of notes indicates subcortical infarct, ambulating 539ft min A .  Prior therapy notes indicate min Guard, hospital day 2 .  Speech eval mild dysarthria. OT , already Sup for Showering.  Pt should cont to progress.  Pt is too high level for CIR.  Rec home with outpt therapy if pt has transportation , if not then HHPT, OT, SLP. If function declines, please reconsult.

## 2014-08-04 NOTE — Progress Notes (Signed)
Occupational Therapy Treatment Patient Details Name: Jake Chen MRN: [id number] DOB: [birth year] Today's Date: 08/04/2014    History of present illness 64 y.o. male with history of DM, HTN presenting with new onset of R hemiparesis and slurred speech. NIH stroke score was 4   OT comments  Pt completed shower standing with supervision; using grab bars for support as needed. Pt able to recall name of OT, and gather needed items for ADL. Pt is impulsive and required VCs to slow down for safety. Education provided on use of grab bars in shower at home. Pt and family requesting CIR prior to d/c home due to Pt living alone and having balance deficits. Pt would benefit from skilled OT for increased safety and independence with ADLs prior to d/c.    Follow Up Recommendations  CIR;Supervision/Assistance - 24 hour    Equipment Recommendations  None recommended by OT    Recommendations for Other Services      Precautions / Restrictions Precautions Precautions: Fall Restrictions Weight Bearing Restrictions: No       Mobility Bed Mobility               General bed mobility comments: Pt in chair upon arrival  Transfers Overall transfer level: Modified independent Equipment used: None Transfers: Sit to/from Stand Sit to Stand: Min guard             Balance Overall balance assessment: Needs assistance Sitting-balance support: Feet supported;No upper extremity supported Sitting balance-Leahy Scale: Good     Standing balance support: No upper extremity supported;During functional activity Standing balance-Leahy Scale: Fair Standing balance comment: Pt able to complete 1 leg stand in shower; no LOB; using grab bar for support                   ADL Overall ADL's : Needs assistance/impaired     Grooming: Oral care;Applying deodorant;Supervision/safety   Upper Body Bathing: Supervision/ safety;Standing   Lower Body Bathing: Supervison/ safety (no LOB during 1  leg stand in shower to wash feet )   Upper Body Dressing : Modified independent;Standing   Lower Body Dressing: Modified independent;Sit to/from stand           Tub/ Shower Transfer: Walk-in shower;Supervision/safety;Ambulation   Functional mobility during ADLs: Supervision/safety General ADL Comments: Pt completed full shower with supervision; able to identify and collect items needed, pick up items from floor with no LOB. Pt is impulsive and requires VC's to slow down.      Vision                     Perception     Praxis      Cognition   Behavior During Therapy: Impulsive Overall Cognitive Status: Impaired/Different from baseline Area of Impairment: Safety/judgement;Attention;Awareness   Current Attention Level: Alternating      Safety/Judgement: Decreased awareness of safety;Decreased awareness of deficits Awareness: Emergent   General Comments: Pt able to recall OT by name; Pt impulsive to complete tasks     Extremity/Trunk Assessment               Exercises     Shoulder Instructions       General Comments      Pertinent Vitals/ Pain       Pain Assessment: No/denies pain  Home Living     Available Help at Discharge: Family;Friend(s);Available PRN/intermittently Type of Home: Apartment  Lives With: Alone    Prior Functioning/Environment              Frequency Min 3X/week     Progress Toward Goals  OT Goals(current goals can now be found in the care plan section)  Progress towards OT goals: Progressing toward goals  Acute Rehab OT Goals Patient Stated Goal: wants to go CIR OT Goal Formulation: With patient Time For Goal Achievement: 08/17/14 Potential to Achieve Goals: Good ADL Goals Pt Will Perform Upper Body Bathing: with supervision;sitting Pt Will Perform Lower Body Bathing: with supervision;sitting/lateral leans Pt Will Perform Tub/Shower Transfer: Tub transfer;with  supervision;ambulating  Plan Discharge plan needs to be updated    Co-evaluation                 End of Session Equipment Utilized During Treatment: Gait belt   Activity Tolerance Patient tolerated treatment well   Patient Left in chair;with call bell/phone within reach;with chair alarm set   Nurse Communication Mobility status        Time: 2440-1027 OT Time Calculation (min): 23 min  Charges: OT General Charges $OT Visit: 1 Procedure OT Treatments $Self Care/Home Management : 8-22 mins  Marden Noble 08/04/2014, 11:04 AM

## 2014-08-04 NOTE — Progress Notes (Addendum)
PROGRESS NOTE  Jake Chen JXB:[id number] DOB: Mar 04, 1950 DOA: 08/02/2014 PCP: [name], MD  Assessment/Plan: CVA (cerebral infarction) HgbA1c- 14, fasting lipid panel: 148 MRI, MRA of the brain +  For CVA PT consult, OT consult, Speech consult - home health--- placement issue- CIR declined Echocardiogram  Carotid dopplers  Prophylactic therapy-Antiplatelet med: Aspirin - dose 325 mg PO daily  Cardiac Monitoring  Neurochecks q4h  Keep MAP 70  Hyperlipidemia: - statins started.  Essential hypertension Allow permisive HTN.  Uncontrolled diabetes mellitus without complication: -SSI, increase levemir -resume home meds  Code Status: full Family Communication: son on phone 7/26- patient asked me not to call 7/27 Disposition Plan:    Consultants: Neuro  Procedures:      HPI/Subjective: Doing well- eating ok  Objective: Filed Vitals:   08/04/14 0924  BP: 98/66  Pulse: 79  Temp: 97.8 F (36.6 C)  Resp: 16    Intake/Output Summary (Last 24 hours) at 08/04/14 1308 Last data filed at 08/04/14 1230  Gross per 24 hour  Intake    480 ml  Output      0 ml  Net    480 ml   There were no vitals filed for this visit.  Exam:   General: pleasant/cooperative  Cardiovascular: rrr  Respiratory: clear  Abdomen: +BS, soft  Musculoskeletal: no edema   Data Reviewed: Basic Metabolic Panel:  Recent Labs Lab 08/02/14 2021  CREATININE 0.76   Liver Function Tests: No results for input(s): AST, ALT, ALKPHOS, BILITOT, PROT, ALBUMIN in the last 168 hours. No results for input(s): LIPASE, AMYLASE in the last 168 hours. No results for input(s): AMMONIA in the last 168 hours. CBC:  Recent Labs Lab 08/02/14 2021  WBC 4.4  HGB 14.2  HCT 42.6  MCV 84.7  PLT 232   Cardiac Enzymes: No results for input(s): CKTOTAL, CKMB, CKMBINDEX, TROPONINI in the last 168 hours. BNP (last 3 results) No results for input(s): BNP in the last 8760 hours.  ProBNP  (last 3 results) No results for input(s): PROBNP in the last 8760 hours.  CBG:  Recent Labs Lab 08/03/14 1214 08/03/14 1630 08/03/14 2150 08/04/14 0644 08/04/14 1137  GLUCAP 245* 259* 193* 192* 271*    No results found for this or any previous visit (from the past 240 hour(s)).   Studies: Dg Chest 2 View  08/02/2014   CLINICAL DATA:  Dysarthria  EXAM: CHEST  2 VIEW  COMPARISON:  03/12/2014  FINDINGS: Lungs are hyper aerated and clear. Normal heart size. No pneumothorax.  IMPRESSION: No active cardiopulmonary disease.   Electronically Signed   By: Jolaine Click M.D.   On: 08/02/2014 22:37   Mr Brain Wo Contrast  08/02/2014   CLINICAL DATA:  Initial evaluation for acute dysarthria.  EXAM: MRI HEAD WITHOUT CONTRAST  MRA HEAD WITHOUT CONTRAST  TECHNIQUE: Multiplanar, multiecho pulse sequences of the brain and surrounding structures were obtained without intravenous contrast. Angiographic images of the head were obtained using MRA technique without contrast.  COMPARISON:  None.  FINDINGS: MRI HEAD FINDINGS  Curvilinear focus of restricted diffusion measuring 13 x 4 x 14 mm present within the left lentiform nucleus with superior extension into the periventricular white matter of the left corona radiata, compatible with acute ischemic nonhemorrhagic infarct. No associated mass effect or hemorrhage. No other acute ischemic infarct. Gray-white matter differentiation maintained. Normal intravascular flow voids are preserved. No acute or chronic intracranial hemorrhage.  No acute or chronic intracranial hemorrhage.  Small remote lacunar infarct  present within the right basal ganglia/right corona radiata. Mild chronic small vessel ischemic changes present within the periventricular white matter. Mild age-related cerebral atrophy.  No mass lesion, midline shift, or mass effect. No hydrocephalus. No extra-axial fluid collection.  Craniocervical junction within normal limits. Pituitary gland normal. No acute  abnormality about the orbits.  Minimal mucosal thickening within the ethmoidal air cells. Paranasal sinuses are otherwise clear. No mastoid effusion. Inner ear structures grossly normal.  Bone marrow signal intensity within normal limits. Scalp soft tissues unremarkable.  MRA HEAD FINDINGS  ANTERIOR CIRCULATION:  Visualized distal cervical segments of the internal carotid arteries are widely patent with antegrade flow. The petrous segments are widely patent. Minimal atheromatous irregularity within the cavernous right ICA without significant stenosis. Cavernous left ICA widely patent. Supraclinoid segments widely patent. A1 segments, anterior communicating artery, anterior cerebral arteries widely patent. M1 segments widely patent without stenosis or occlusion. MCA bifurcations within normal limits. Distal MCA branches symmetric and well opacified bilaterally.  POSTERIOR CIRCULATION:  Vertebral arteries are widely patent to the vertebrobasilar junction. Posterior inferior cerebral arteries patent bilaterally. Basilar artery well opacified and widely patent. Right anterior inferior cerebral artery is dominant. Superior cerebellar arteries patent bilaterally. Both posterior cerebral arteries arise from the basilar artery and are well opacified without stenosis or occlusion.  No aneurysm or vascular malformation.  IMPRESSION: MRI HEAD IMPRESSION:  1. Acute ischemic nonhemorrhagic infarct involving the left lentiform nucleus with superior extension into the periventricular white matter of the left corona radiata. 2. Remote lacunar type infarction within the right basal ganglia/corona radiata. 3. Mild chronic microvascular ischemic disease.  MRA HEAD IMPRESSION:  Normal MRA of the intracranial circulation. No early or proximal arterial branch occlusion identified. No high grade or correctable stenosis.   Electronically Signed   By: Rise Mu M.D.   On: 08/02/2014 22:22   Mr Maxine Glenn Head/brain Wo Cm  08/02/2014    CLINICAL DATA:  Initial evaluation for acute dysarthria.  EXAM: MRI HEAD WITHOUT CONTRAST  MRA HEAD WITHOUT CONTRAST  TECHNIQUE: Multiplanar, multiecho pulse sequences of the brain and surrounding structures were obtained without intravenous contrast. Angiographic images of the head were obtained using MRA technique without contrast.  COMPARISON:  None.  FINDINGS: MRI HEAD FINDINGS  Curvilinear focus of restricted diffusion measuring 13 x 4 x 14 mm present within the left lentiform nucleus with superior extension into the periventricular white matter of the left corona radiata, compatible with acute ischemic nonhemorrhagic infarct. No associated mass effect or hemorrhage. No other acute ischemic infarct. Gray-white matter differentiation maintained. Normal intravascular flow voids are preserved. No acute or chronic intracranial hemorrhage.  No acute or chronic intracranial hemorrhage.  Small remote lacunar infarct present within the right basal ganglia/right corona radiata. Mild chronic small vessel ischemic changes present within the periventricular white matter. Mild age-related cerebral atrophy.  No mass lesion, midline shift, or mass effect. No hydrocephalus. No extra-axial fluid collection.  Craniocervical junction within normal limits. Pituitary gland normal. No acute abnormality about the orbits.  Minimal mucosal thickening within the ethmoidal air cells. Paranasal sinuses are otherwise clear. No mastoid effusion. Inner ear structures grossly normal.  Bone marrow signal intensity within normal limits. Scalp soft tissues unremarkable.  MRA HEAD FINDINGS  ANTERIOR CIRCULATION:  Visualized distal cervical segments of the internal carotid arteries are widely patent with antegrade flow. The petrous segments are widely patent. Minimal atheromatous irregularity within the cavernous right ICA without significant stenosis. Cavernous left ICA widely patent. Supraclinoid segments widely patent.  A1 segments, anterior  communicating artery, anterior cerebral arteries widely patent. M1 segments widely patent without stenosis or occlusion. MCA bifurcations within normal limits. Distal MCA branches symmetric and well opacified bilaterally.  POSTERIOR CIRCULATION:  Vertebral arteries are widely patent to the vertebrobasilar junction. Posterior inferior cerebral arteries patent bilaterally. Basilar artery well opacified and widely patent. Right anterior inferior cerebral artery is dominant. Superior cerebellar arteries patent bilaterally. Both posterior cerebral arteries arise from the basilar artery and are well opacified without stenosis or occlusion.  No aneurysm or vascular malformation.  IMPRESSION: MRI HEAD IMPRESSION:  1. Acute ischemic nonhemorrhagic infarct involving the left lentiform nucleus with superior extension into the periventricular white matter of the left corona radiata. 2. Remote lacunar type infarction within the right basal ganglia/corona radiata. 3. Mild chronic microvascular ischemic disease.  MRA HEAD IMPRESSION:  Normal MRA of the intracranial circulation. No early or proximal arterial branch occlusion identified. No high grade or correctable stenosis.   Electronically Signed   By: Rise Mu M.D.   On: 08/02/2014 22:22    Scheduled Meds: . aspirin  300 mg Rectal Daily   Or  . aspirin  325 mg Oral Daily  . atorvastatin  40 mg Oral q1800  . glipiZIDE  10 mg Oral BID AC  . heparin  5,000 Units Subcutaneous 3 times per day  . insulin aspart  0-15 Units Subcutaneous TID WC  . insulin detemir  10 Units Subcutaneous QHS  . metFORMIN  1,000 mg Oral Q breakfast  . perflutren lipid microspheres (DEFINITY) IV suspension       Continuous Infusions:   Antibiotics Given (last 72 hours)    None      Principal Problem:   CVA (cerebral infarction) Active Problems:   Hyperlipidemia   Essential hypertension   Uncontrolled diabetes mellitus without complication    Time spent: 25  min    Lala Been  [facility] Pager [phone]  If 7PM-7AM, please contact night-coverage at [email or web address], password [email or web address] 08/04/2014, 1:08 PM  LOS: 2 days

## 2014-08-04 NOTE — Progress Notes (Signed)
STROKE TEAM PROGRESS NOTE   HISTORY Jake Chen is an 64 y.o. male with a history of diabetes mellitus and hypertension presenting with new onset right hemiparesis and slurred speech. Symptoms were present 21 this morning. He was last known well at midnight last night. He has no previous history of stroke nor TIA. He has not been taking aspirin on a regular basis. CT scan of his head reportedly was unremarkable with no acute intracranial abnormality. MRI studies pending. NIH stroke score was 4.  LSN: 12:00 AM on 08/02/2014 tPA Given: No: Beyond time under for treatment consideration mRankin:   SUBJECTIVE (INTERVAL HISTORY)   The patient still has slurred speech.  He has been seen by therapist and rehabilitation coordinator and final disposition is pending  OBJECTIVE Temp:  [97.7 F (36.5 C)-98.6 F (37 C)] 97.8 F (36.6 C) (07/27 0924) Pulse Rate:  [74-80] 79 (07/27 0924) Cardiac Rhythm:  [-] Sinus tachycardia (07/27 0000) Resp:  [16-19] 16 (07/27 0924) BP: (98-160)/(66-97) 98/66 mmHg (07/27 0924) SpO2:  [98 %-100 %] 100 % (07/27 0924)   Recent Labs Lab 08/03/14 1214 08/03/14 1630 08/03/14 2150 08/04/14 0644 08/04/14 1137  GLUCAP 245* 259* 193* 192* 271*    Recent Labs Lab 08/02/14 2021  CREATININE 0.76   No results for input(s): AST, ALT, ALKPHOS, BILITOT, PROT, ALBUMIN in the last 168 hours.  Recent Labs Lab 08/02/14 2021  WBC 4.4  HGB 14.2  HCT 42.6  MCV 84.7  PLT 232   No results for input(s): CKTOTAL, CKMB, CKMBINDEX, TROPONINI in the last 168 hours. No results for input(s): LABPROT, INR in the last 72 hours. No results for input(s): COLORURINE, LABSPEC, PHURINE, GLUCOSEU, HGBUR, BILIRUBINUR, KETONESUR, PROTEINUR, UROBILINOGEN, NITRITE, LEUKOCYTESUR in the last 72 hours.  Invalid input(s): APPERANCEUR     Component Value Date/Time   CHOL 237* 08/03/2014 0613   TRIG 193* 08/03/2014 0613   HDL 50 08/03/2014 0613   CHOLHDL 4.7 08/03/2014 0613   VLDL  39 08/03/2014 0613   LDLCALC 148* 08/03/2014 0613   Lab Results  Component Value Date   HGBA1C 15.4* 08/03/2014   No results found for: LABOPIA, COCAINSCRNUR, LABBENZ, AMPHETMU, THCU, LABBARB  No results for input(s): ETH in the last 168 hours.    Imaging  Dg Chest 2 View 08/02/2014    No active cardiopulmonary disease.     Mr Brain Wo Contrast 08/02/2014     MRI HEAD   1. Acute ischemic nonhemorrhagic infarct involving the left lentiform nucleus with superior extension into the periventricular white matter of the left corona radiata.  2. Remote lacunar type infarction within the right basal ganglia/corona radiata.  3. Mild chronic microvascular ischemic disease.     MRA HEAD   Normal MRA of the intracranial circulation.  No early or proximal arterial branch occlusion identified. No high grade or correctable stenosis.       PHYSICAL EXAM Pleasant middle-aged African-American male currently not in distress. . Afebrile. Head is nontraumatic. Neck is supple without bruit.    Cardiac exam no murmur or gallop. Lungs are clear to auscultation. Distal pulses are well felt. Neurological Exam :  Awake alert oriented 3 with normal speech and language function except mild dysarthria. Pupils are equal reactive to light and accommodation. Fundi were not visualized. Vision acuity and fields seem normal. Minimum right lower facial asymmetry when he smiles. Tongue midline. Motor system exam reveals no upper or lower extremity drift. Mild weakness of right grip and intrinsic hand muscles. Orbits  left or right approximately. Symmetric lower extremity strength. Deep tendon reflexes are symmetric. Plantars are downgoing. Sensation is intact. Gait was not tested.  NIH stroke scale score 2   ASSESSMENT/PLAN Mr. DYKE WEIBLE is a 64 y.o. male with history of diabetes mellitus, hyperlipidemia, and hypertension, presenting with right hemiparesis and dysarthria.Marland Kitchen He did not receive IV t-PA due to  late presentation.   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   Carotid Doppler - Bilateral: 1-39% ICA stenosis. Vertebral artery flow is antegrade.   2D Echo normal EF  LDL 148  HgbA1c 14.7  Subcutaneous heparin for VTE prophylaxis Diet Carb Modified Fluid consistency:: Thin; Room service appropriate?: Yes  no antithrombotic prior to admission, now on aspirin 325 mg orally every day  Patient counseled to be compliant with his antithrombotic medications  Ongoing aggressive stroke risk factor management  Therapy recommendations: Home health occupational therapy. Physical therapy evaluation is pending.  Disposition: Pending  Hypertension  Home meds: No antihypertensives medications prior to admission.  Stable  Permissive hypertension <220/120 for 24-48 hours and then gradually normalize within 5-7 days  Patient counseled to be compliant with his blood pressure medications  Hyperlipidemia  Home meds:  No lipid lowering medications prior to admission  LDL 148, goal < 70  Now on Lipitor 40 mg daily  Continue statin at discharge   Diabetes  HgbA1c 14.7, goal < 7.0  Uncontrolled    Other Stroke Risk Factors  Advanced age  Hx stroke/TIA  (remote lacunar stroke by MRI)   Other Active Problems    Other Pertinent History    Hospital day # 2     I have personally examined this patient, reviewed notes, independently viewed imaging studies, participated in medical decision making and plan of care. I have made any additions or clarifications directly to the above note. Marland Kitchen He presented with right hemiparesis secondary to small left subcortical infarct etiology likely small vessel disease. He remains at risk for neurological worsening, recurrent stroke, TIAs and needs  ongoing stroke evaluation and aggressive risk factor modification. Continue aspirin and Lipitor for stroke prevention stroke team will sign off. Kindly call for questions.  Delia Heady, MD Medical Director [facility] [place] [facility] Pager: [phone] 08/04/2014 12:59 PM     To contact Stroke Continuity provider, please refer to [email or web address]. After hours, contact General Neurology

## 2014-08-04 NOTE — Progress Notes (Signed)
Met with patient to discuss discharge planning. Patient is not a candidate for CIR or SNF and is unable to pay privately.  CM discussed home health, and patient is agreeable to this.  Patient resides in [place], [place].  Efforts are underway to find a home health agency that is able to provide the ordered services in that area.  Plan is for discharge home on 08/05/14.  CM will continue to make appropriate arrangements prior to discharge.  CM offered to call patient's son to discuss the discharge plan, but patient declined to allow CM to make that call. Dr Eliseo Squires is aware.

## 2014-08-04 NOTE — Progress Notes (Signed)
Physical Therapy Treatment Patient Details Name: Jake Chen MRN: [id number] DOB: 02-27-1950 Today's Date: 08/04/2014    History of Present Illness 64 y.o. male with history of DM, HTN presenting with new onset of R hemiparesis and slurred speech. NIH stroke score was 4    PT Comments    Pt is progression towards goals. Pt has demo improved mobility and ambulation however pt still has impairments inhibiting patients ability to return home alone at this time. Pt with noted decreased attention/ability to focus, impaired balance as demo'd by score of 13 on DGI, impaired problem solving, mild ataixa and LE co-ordination deficits. Pt demo's excellent rehab potential and suspect pt to be able to reach mod I level of function for safe d/c home once CIR rehab complete.    Follow Up Recommendations  CIR     Equipment Recommendations   (TBD)    Recommendations for Other Services Rehab consult     Precautions / Restrictions Precautions Precautions: Fall Restrictions Weight Bearing Restrictions: No    Mobility  Bed Mobility               General bed mobility comments: Pt in chair upon arrival  Transfers Overall transfer level: Modified independent Equipment used: None Transfers: Sit to/from Stand Sit to Stand: Min guard         General transfer comment: pt with good technique however impulsive/quick to move requiring min guard for safety  Ambulation/Gait Ambulation/Gait assistance: Min assist Ambulation Distance (Feet): 500 Feet (200 with RW and 300 without AD) Assistive device: Rolling walker (2 wheeled);None Gait Pattern/deviations: Step-through pattern;Scissoring;Staggering right;Staggering left;Wide base of support Gait velocity: decreased Gait velocity interpretation:  (pt impulsively quick) General Gait Details: transitioned pt to ambulation with no device this date however unsteady requiring minA to maintain balance. Pt demo'd lateral drift both L and R. Pt  with improved stability with RW however demonstrates mild ataxia with gait pattern with impaired co-ordination as well.    Stairs Stairs: Yes Stairs assistance: Min assist Stair Management: One rail Left;One rail Right;Forwards;Step to pattern Number of Stairs: 12 General stair comments: pt negotiated stairs with using L hand railing ascending, vc to step both feet on step before advancing to the next one, pt descended stairs using R hand railing. no LOB . pt attempted to complete reciprocal gait pattern however impulsive and quick resulting in decreased safety requiring v/c's to utilize step to gait pattern  Wheelchair Mobility    Modified Rankin (Stroke Patients Only) Modified Rankin (Stroke Patients Only) Pre-Morbid Rankin Score: No symptoms Modified Rankin: Moderate disability     Balance Overall balance assessment: Needs assistance Sitting-balance support: Feet supported;No upper extremity supported Sitting balance-Leahy Scale: Good     Standing balance support: No upper extremity supported;During functional activity Standing balance-Leahy Scale: Fair Standing balance comment: Pt able to complete 1 leg stand in shower; no LOB                    Cognition Arousal/Alertness: Awake/alert Behavior During Therapy: Impulsive Overall Cognitive Status: Impaired/Different from baseline Area of Impairment: Safety/judgement;Attention;Awareness   Current Attention Level: Alternating     Safety/Judgement: Decreased awareness of safety;Decreased awareness of deficits Awareness: Emergent   General Comments: Pt able to recall OT by name; Pt impulsive to complete tasks     Exercises      General Comments General comments (skin integrity, edema, etc.): pt is highly motivated, pt showed improvements from past therapy session  Pertinent Vitals/Pain Pain Assessment: No/denies pain    Home Living     Available Help at Discharge: Family;Friend(s);Available  PRN/intermittently Type of Home: Apartment              Prior Function            PT Goals (current goals can now be found in the care plan section) Acute Rehab PT Goals Patient Stated Goal: wants to go CIR Progress towards PT goals: Progressing toward goals    Frequency  Min 4X/week    PT Plan Discharge plan needs to be updated    Co-evaluation             End of Session Equipment Utilized During Treatment: Gait belt Activity Tolerance: Patient tolerated treatment well Patient left: in chair;with call bell/phone within reach;with chair alarm set     Time: 9528-4132 PT Time Calculation (min) (ACUTE ONLY): 30 min  Charges:  $Gait Training: 23-37 mins          Acute Rehab [phone]          G Codes:      Carren Rang 2014-08-09, 10:36 AM    Carren Rang, SPTA (student physical therapy assistant) Acute Rehab [phone]

## 2014-08-04 NOTE — Care Management Important Message (Signed)
Important Message  Patient Details  Name: Jake Chen MRN: [id number] Date of Birth: 1950/08/24   Medicare Important Message Given:  Yes-second notification given    Orson Aloe 08/04/2014, 12:30 PM

## 2014-08-04 NOTE — Progress Notes (Signed)
Echocardiogram 2D Echocardiogram with Definity has been performed.  Jake Chen 08/04/2014, 12:17 PM

## 2014-08-04 NOTE — Clinical Social Work Note (Signed)
Clinical Social Work Assessment  Patient Details  Name: Jake Chen MRN: [id number] Date of Birth: 12/18/1950  Date of referral:  08/04/14               Reason for consult:  Discharge Planning                Permission sought to share information with:  Case Manager Permission granted to share information::  No  Name::      (N/A)  Agency::   (N/A)  Relationship::   (N/A)  Contact Information:   (N/A)  Housing/Transportation Living arrangements for the past 2 months:  Apartment Source of Information:  Patient Patient Interpreter Needed:  None Criminal Activity/Legal Involvement Pertinent to Current Situation/Hospitalization:  No - Comment as needed Significant Relationships:  Adult Children Lives with:  Self Do you feel safe going back to the place where you live?  Yes Need for family participation in patient care:  Yes (Comment)  Care giving concerns:  Patient reportedly has 36 stairs at [place].    Social Worker assessment / plan:  Holiday representative met with patient in reference to discharge planning. CIR confirmed patient is doing too well for IP REHAB. Patient is ambulating 500 feet, 200 with walker and 300 without. Patient does NOT qualify under Medicare guidelines for SNF placement. RNCM and MD notified. RNCM has presented additional discharge options for patient however patient refusing to stay with son. Patient will NOT allow staff to contact son at this time (7/27). CSW addressed patient reportedly expressing his feeling of being a burden on family. Patient reported he feels like a burden on family because he "hates" asking for help. Patient's son, [name] has reportedly invited patient to move in with him however patient has declined. Patient also stated he does not have transportation home at discharge. Patient gave [facility] permission to contact son for transportation arrangements. Patient asked about [facility] providing a rental car and CSW explained that [facility] does not provide  rental car assistance. RNCM has contacted son and left a message. No further concerns reported by family at this time. Clinical Social Worker will sign off for now as social work intervention is no longer needed. Please consult [place] again if new need arises.   Employment status:  Retired Health visitor PT Recommendations:  Home with [employer], [place] / Referral to community resources:   (None )  Patient/Family's Response to care:  Pt sitting at bedside comfortably. Pt pleasant during assessment and appreciated social work intervention.   Patient/Family's Understanding of and Emotional Response to Diagnosis, Current Treatment, and Prognosis:  Patient understands he was positive for stroke and that he will be responsible for making transportation arrangements with family. CSW signing off.   Emotional Assessment Appearance:  Appears stated age Attitude/Demeanor/Rapport:   (Pleasant ) Affect (typically observed):  Appropriate Orientation:  Oriented to Self, Oriented to Place, Oriented to  Time, Oriented to Situation Alcohol / Substance use:  Not Applicable Psych involvement (Current and /or in the community):  No (Comment)  Discharge Needs  Concerns to be addressed:  Discharge Planning Concerns Readmission within the last 30 days:  No Current discharge risk:  None Barriers to Discharge:  Barriers Resolved   Glendon Axe, MSW, LCSWA [phone] 08/05/2014 2:06 PM

## 2014-08-05 LAB — GLUCOSE, CAPILLARY
Glucose-Capillary: 130 mg/dL — ABNORMAL HIGH (ref 65–99)
Glucose-Capillary: 230 mg/dL — ABNORMAL HIGH (ref 65–99)

## 2014-08-05 MED ORDER — ATORVASTATIN CALCIUM 40 MG PO TABS: 40.0000 mg | ORAL_TABLET | Freq: Every day | ORAL | Status: DC

## 2014-08-05 MED ORDER — ASPIRIN 325 MG PO TABS: 325.0000 mg | ORAL_TABLET | Freq: Every day | ORAL | Status: DC

## 2014-08-05 NOTE — Progress Notes (Signed)
Spoke with patient's son, Jake Chen, via phone to discuss discharge planning. Patient's son expressed understanding, but does not feel that the discharge plan is ideal.  Son has been trying to talk with patient about moving closer to family and out of his third floor condo that does not have an elevator.  Patient is reluctant to move and feels that he will be fine at home.  CM discussed that patient will be discharging home with the [facility].  Patient's son is aware that patient has been discharged and will need to be picked up sometime today.  Son will call patient to discuss transportation options.  Dr Benjamine Mola aware.

## 2014-08-05 NOTE — Progress Notes (Signed)
Physical Therapy Treatment Patient Details Name: Jake Chen MRN: [id number] DOB: [birth year] Today's Date: 08/05/2014    History of Present Illness 64 y.o. male with history of DM, HTN presenting with new onset of R hemiparesis and slurred speech. NIH stroke score was 4    PT Comments    Pt demonstrated ability to ambulate 250 ft and ascend/descend 24 steps this session w/ min guard.  Pt has not been approved for CIR and therefore will be d/c to home.  Recommending HHPT upon return home.  Educated pt on importance of having assist at home, especially for stairs, pt reports he has several friends that live in his complex who will help him w/ mobility.  Pt will benefit from continued skilled PT services to increase functional independence and safety.   Follow Up Recommendations  Home health PT;Supervision/Assistance - 24 hour     Equipment Recommendations  Rolling walker with 5" wheels    Recommendations for Other Services       Precautions / Restrictions Precautions Precautions: Fall Restrictions Weight Bearing Restrictions: No    Mobility  Bed Mobility               General bed mobility comments: Patient in chair as PT entered room  Transfers Overall transfer level: Needs assistance Equipment used: Rolling walker (2 wheeled) Transfers: Sit to/from Stand Sit to Stand: Min guard         General transfer comment: Pt stands before RW placed in front of him despite VCs to wait.  Pt is impulsive when standing and sitting and requires cues to keep RW with him when turning to sit down.  Ambulation/Gait Ambulation/Gait assistance: Min guard Ambulation Distance (Feet): 250 Feet Assistive device: Rolling walker (2 wheeled) Gait Pattern/deviations: Step-through pattern;Antalgic;Staggering right;Staggering left;Wide base of support Gait velocity: decreased Gait velocity interpretation: Below normal speed for age/gender General Gait Details: Slight trunk flexion which  improved following VCs.  Relies on RW for support to maintain balance.     Stairs Stairs: Yes Stairs assistance: Min guard Stair Management: One rail Left;Forwards;Step to pattern;Alternating pattern Number of Stairs: 24 General stair comments: Pt negotiated stairs using L rail during ascent and descent w/ min guard for safety.  Pt attempted to alternate steps; however cues were provided to remind pt to perform step to in order to ensure stability and safety, pt verbalized and demonstrated understanding.    Wheelchair Mobility    Modified Rankin (Stroke Patients Only) Modified Rankin (Stroke Patients Only) Pre-Morbid Rankin Score: No symptoms Modified Rankin: Moderate disability     Balance Overall balance assessment: Needs assistance Sitting-balance support: No upper extremity supported;Feet supported Sitting balance-Leahy Scale: Good     Standing balance support: Bilateral upper extremity supported Standing balance-Leahy Scale: Poor Standing balance comment: Pt w/ instability if he doesn't have support of RW                    Cognition Arousal/Alertness: Awake/alert Behavior During Therapy: Impulsive Overall Cognitive Status: Impaired/Different from baseline Area of Impairment: Safety/judgement;Attention;Awareness   Current Attention Level: Alternating     Safety/Judgement: Decreased awareness of safety;Decreased awareness of deficits Awareness: Emergent   General Comments: decrease attention span, impulsive to complete task    Exercises General Exercises - Lower Extremity Mini-Sqauts: Strengthening;Both;10 reps;Limitations;Standing Mini Squats Limitations: Pt weight shifts slightly to LLE during descent Other Exercises Other Exercises: Single leg stance: Pt able to maintain balance for 30 sec w/ min guard assist standing on LLE and holding  on w/ R index and middle fingers on counter.  Pt able to maintain balance for 30 seconds standing on RLE and holding  onto counter w/ L hand.      General Comments        Pertinent Vitals/Pain Pain Assessment: No/denies pain    Home Living                      Prior Function            PT Goals (current goals can now be found in the care plan section) Acute Rehab PT Goals PT Goal Formulation: With patient Time For Goal Achievement: 08/10/14 Potential to Achieve Goals: Good Progress towards PT goals: Progressing toward goals    Frequency  Min 4X/week    PT Plan Discharge plan needs to be updated    Co-evaluation             End of Session Equipment Utilized During Treatment: Gait belt Activity Tolerance: Patient tolerated treatment well Patient left: in chair;with call bell/phone within reach;with chair alarm set     Time: 1412-1430 PT Time Calculation (min) (ACUTE ONLY): 18 min  Charges:  $Gait Training: 8-22 mins                    G Codes:      Michail Jewels PT, [place] [phone] Pager: [phone] 08/05/2014, 2:54 PM

## 2014-08-05 NOTE — Progress Notes (Signed)
STROKE TEAM PROGRESS NOTE   HISTORY Jake Chen is an 64 y.o. male with a history of diabetes mellitus and hypertension presenting with new onset right hemiparesis and slurred speech. Symptoms were present 21 this morning. He was last known well at midnight last night. He has no previous history of stroke nor TIA. He has not been taking aspirin on a regular basis. CT scan of his head reportedly was unremarkable with no acute intracranial abnormality. MRI studies pending. NIH stroke score was 4.  LSN: 12:00 AM on 08/02/2014 tPA Given: No: Beyond time under for treatment consideration mRankin:   SUBJECTIVE (INTERVAL HISTORY)   The patient still has slurred speech.  He is for discharge home today. Transthoracic echo shows normal ejection fraction without cardiac source of embolism.  OBJECTIVE Temp:  [97.3 F (36.3 C)-98.5 F (36.9 C)] 98.1 F (36.7 C) (07/28 1328) Pulse Rate:  [70-88] 86 (07/28 1328) Cardiac Rhythm:  [-] Normal sinus rhythm (07/27 2100) Resp:  [16-20] 20 (07/28 1328) BP: (103-141)/(68-88) 141/82 mmHg (07/28 1328) SpO2:  [99 %-100 %] 100 % (07/28 1328)   Recent Labs Lab 08/04/14 1137 08/04/14 1621 08/04/14 2149 08/05/14 0621 08/05/14 1121  GLUCAP 271* 169* 171* 130* 230*    Recent Labs Lab 08/02/14 2021  CREATININE 0.76   No results for input(s): AST, ALT, ALKPHOS, BILITOT, PROT, ALBUMIN in the last 168 hours.  Recent Labs Lab 08/02/14 2021  WBC 4.4  HGB 14.2  HCT 42.6  MCV 84.7  PLT 232   No results for input(s): CKTOTAL, CKMB, CKMBINDEX, TROPONINI in the last 168 hours. No results for input(s): LABPROT, INR in the last 72 hours. No results for input(s): COLORURINE, LABSPEC, PHURINE, GLUCOSEU, HGBUR, BILIRUBINUR, KETONESUR, PROTEINUR, UROBILINOGEN, NITRITE, LEUKOCYTESUR in the last 72 hours.  Invalid input(s): APPERANCEUR     Component Value Date/Time   CHOL 237* 08/03/2014 0613   TRIG 193* 08/03/2014 0613   HDL 50 08/03/2014 0613   CHOLHDL 4.7 08/03/2014 0613   VLDL 39 08/03/2014 0613   LDLCALC 148* 08/03/2014 0613   Lab Results  Component Value Date   HGBA1C 15.4* 08/03/2014   No results found for: LABOPIA, COCAINSCRNUR, LABBENZ, AMPHETMU, THCU, LABBARB  No results for input(s): ETH in the last 168 hours.    Imaging  Dg Chest 2 View 08/02/2014    No active cardiopulmonary disease.     Mr Brain Wo Contrast 08/02/2014     MRI HEAD   1. Acute ischemic nonhemorrhagic infarct involving the left lentiform nucleus with superior extension into the periventricular white matter of the left corona radiata.  2. Remote lacunar type infarction within the right basal ganglia/corona radiata.  3. Mild chronic microvascular ischemic disease.     MRA HEAD   Normal MRA of the intracranial circulation.  No early or proximal arterial branch occlusion identified. No high grade or correctable stenosis.       PHYSICAL EXAM Pleasant middle-aged African-American male currently not in distress. . Afebrile. Head is nontraumatic. Neck is supple without bruit.    Cardiac exam no murmur or gallop. Lungs are clear to auscultation. Distal pulses are well felt. Neurological Exam :  Awake alert oriented 3 with normal speech and language function except mild dysarthria. Pupils are equal reactive to light and accommodation. Fundi were not visualized. Vision acuity and fields seem normal. Minimum right lower facial asymmetry when he smiles. Tongue midline. Motor system exam reveals no upper or lower extremity drift. Mild weakness of right grip and intrinsic  hand muscles. Orbits left or right approximately. Symmetric lower extremity strength. Deep tendon reflexes are symmetric. Plantars are downgoing. Sensation is intact. Gait was not tested.  NIH stroke scale score 2   ASSESSMENT/PLAN Mr. Jake Chen is a 64 y.o. male with history of diabetes mellitus, hyperlipidemia, and hypertension, presenting with right hemiparesis and  dysarthria.Marland Kitchen He did not receive IV t-PA due to late presentation.   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   Carotid Doppler - Bilateral: 1-39% ICA stenosis. Vertebral artery flow is antegrade.   2D Echo normal EF  LDL 148  HgbA1c 14.7  Subcutaneous heparin for VTE prophylaxis Diet Carb Modified Fluid consistency:: Thin; Room service appropriate?: Yes Diet - low sodium heart healthy Diet Carb Modified  no antithrombotic prior to admission, now on aspirin 325 mg orally every day  Patient counseled to be compliant with his antithrombotic medications  Ongoing aggressive stroke risk factor management  Therapy recommendations: Home health occupational therapy. Physical therapy evaluation is pending.  Disposition: Pending  Hypertension  Home meds: No antihypertensives medications prior to admission.  Stable  Permissive hypertension <220/120 for 24-48 hours and then gradually normalize within 5-7 days  Patient counseled to be compliant with his blood pressure medications  Hyperlipidemia  Home meds:  No lipid lowering medications prior to admission  LDL 148, goal < 70  Now on Lipitor 40 mg daily  Continue statin at discharge   Diabetes  HgbA1c 14.7, goal < 7.0  Uncontrolled    Other Stroke Risk Factors  Advanced age  Hx stroke/TIA  (remote lacunar stroke by MRI)   Other Active Problems    Other Pertinent History    Hospital day # 3     I have personally examined this patient, reviewed notes, independently viewed imaging studies, participated in medical decision making and plan of care. I have made any additions or clarifications directly to the above note. Marland Kitchen He presented with right hemiparesis secondary to small left subcortical infarct etiology likely small  vessel disease. He remains at risk for neurological worsening, recurrent stroke, TIAs and needs ongoing stroke evaluation and aggressive risk factor modification. Continue aspirin and Lipitor for stroke prevention stroke team will sign off. Kindly call for questions.  Delia Heady, MD Medical Director [facility] Pager: [phone] 08/05/2014 2:05 PM     To contact Stroke Continuity provider, please refer to [email or web address]. After hours, contact General Neurology

## 2014-08-05 NOTE — Progress Notes (Signed)
Met with patient who states that he is unable to get a ride home.  CM spoke with patient regarding possible options. Patient is reluctant to call his son, Gaspar Bidding, stating that "today is not a good day".  Patient has a daughter who lives in [place], but he is also reluctant to call her.  CM requested patient's permission to speak with his son.  Permission was granted.  CM attempted to reach patient's son. Voicemail was left.  Awaiting return call.

## 2014-08-05 NOTE — Care Management Note (Signed)
Case Management Note  Patient Details  Name: Jake Chen MRN: [id number] Date of Birth: 1950-04-24  Subjective/Objective:                    Action/Plan: CM made phone calls to multiple [facility] agencies on 08/04/14 to see if any agency was able to provide the ordered [facility] services in [place], [place].  CM received a call back from [facility] stating that they are able to accept the patient for probable discharge home today.  Per conversation with patient on 08/04/14, he will be discharging home alone, but lives in a condo where he has 5 or 6 other individuals/friends that "look out for each other". He also reports that there are 2 managers that are available 24/7 on site who can provide assistance in emergency situations.  Expected Discharge Date:                  Expected Discharge Plan:  Home w Home Health Services  In-House Referral:     Discharge planning Services  CM Consult  Post Acute Care Choice:    Choice offered to:  Patient  DME Arranged:    DME Agency:     HH Arranged:  RN, PT, OT, Nurse's Aide, Speech Therapy (CSW) HH Agency:  [facility]  Status of Service:  Completed, signed off  Medicare Important Message Given:  Yes-second notification given Date Medicare IM Given:    Medicare IM give by:    Date Additional Medicare IM Given:    Additional Medicare Important Message give by:     If discussed at Long Length of Stay Meetings, dates discussed:    Additional Comments:  Anda Kraft, RN 08/05/2014, 8:10 AM

## 2014-08-05 NOTE — Discharge Summary (Addendum)
Physician Discharge Summary  Jake Chen MHD:[id number] DOB: 07/01/1950 DOA: 08/02/2014  PCP: Cathlean Cower, MD  Admit date: 08/02/2014 Discharge date: 08/05/2014  Time spent: 35 minutes  Recommendations for Outpatient Follow-up:  1. maxed out home health as unable to get patient into SNF/CIR 2. NEEDS SQ insulin but patient resistant to starting 3. Patient needs full cognitive evaluation with neurology as outpatient  Discharge Diagnoses:  Principal Problem:   CVA (cerebral infarction) Active Problems:   Hyperlipidemia   Essential hypertension   Uncontrolled diabetes mellitus without complication   Dysarthria   Discharge Condition: improved  Diet recommendation: carb mod  There were no vitals filed for this visit.  History of present illness:  Jake Chen is a 64 y.o. male with past medical history of hypertension and diabetes for over 10 years that comes in for dysarthria and right leg weakness this started on the morning of admission he went to the ED in [place] where he was transferred to [facility]. Oliveri negative CT scan of the head that showed no acute bleeds. The patient relates has been having trouble swallowing and trouble with balance. He relates no dizziness no change in vision. He relates he does not been taking his aspirin regularly. He relates no falls. He was not taking aspirin at home.  Hospital Course:  CVA (cerebral infarction) HgbA1c- 14, fasting lipid panel: 148 MRI, MRA of the brain + For CVA PT consult, OT consult, Speech consult - home health--- placement issue as lives alone- CIR declined- not appropriate for SNF -patient did not want me to call family Echocardiogram: Technically difficult study. Definity contrast used. LVEF 55-60%, normal wall thickness, inadequate for regional wall motion assessment, diastolic dysfunction, normal LV filling pressure, normal LA size.  Carotid dopplers : Findings consistent with 1- 39 percent stenosis  involving the right internal carotid artery and the left internal carotid artery. Prophylactic therapy-Antiplatelet med: Aspirin - dose 325 mg PO daily    Hyperlipidemia: - statins started.  Essential hypertension Allow permisive HTN.  Uncontrolled diabetes mellitus without complication: -SSI, increase levemir -resume home meds  Procedures:  Echo  carotid  Consultations:  neuro  Discharge Exam: Filed Vitals:   08/05/14 1328  BP: 141/82  Pulse: 86  Temp: 98.1 F (36.7 C)  Resp: 20    General: A+Ox3, NAD- speech slurred  Cardiovascular: rrr Respiratory: clear  Discharge Instructions   Discharge Instructions    Diet - low sodium heart healthy    Complete by:  As directed      Diet Carb Modified    Complete by:  As directed      Discharge instructions    Complete by:  As directed   Home health- max out     Discharge instructions    Complete by:  As directed   No driving until seen by PCP     Increase activity slowly    Complete by:  As directed           Current Discharge Medication List    START taking these medications   Details  aspirin 325 MG tablet Take 1 tablet (325 mg total) by mouth daily. Qty: 30 tablet, Refills: 0    atorvastatin (LIPITOR) 40 MG tablet Take 1 tablet (40 mg total) by mouth daily at 6 PM. Qty: 30 tablet, Refills: 0      CONTINUE these medications which have NOT CHANGED   Details  Blood Glucose Monitoring Suppl (ONE TOUCH ULTRA 2) W/DEVICE KIT Use as directed  Qty: 1 each, Refills: 0    glipiZIDE (GLUCOTROL) 10 MG tablet Take 1 tablet (10 mg total) by mouth 2 (two) times daily before a meal. Qty: 60 tablet, Refills: 11    glucose blood (ONE TOUCH ULTRA TEST) test strip Use as instructed twice per day = type 2 DM uncontrolled Qty: 200 each, Refills: 12    metFORMIN (GLUCOPHAGE-XR) 500 MG 24 hr tablet Take 2 tablets (1,000 mg total) by mouth daily with breakfast. Qty: 120 tablet, Refills: 11       No Known  Allergies Follow-up Information    Follow up with [facility].   Why:  Your local office that covers the [place] area will contact you to set up an appointment   Contact information:   [address] [place] [place] [address] [phone]       Follow up with Cathlean Cower, MD In 1 week.   Specialties:  Internal Medicine, Radiology   Contact information:   [address] [place] [address] [phone]        The results of significant diagnostics from this hospitalization (including imaging, microbiology, ancillary and laboratory) are listed below for reference.    Significant Diagnostic Studies: Dg Chest 2 View  08/02/2014   CLINICAL DATA:  Dysarthria  EXAM: CHEST  2 VIEW  COMPARISON:  03/12/2014  FINDINGS: Lungs are hyper aerated and clear. Normal heart size. No pneumothorax.  IMPRESSION: No active cardiopulmonary disease.   Electronically Signed   By: Marybelle Killings M.D.   On: 08/02/2014 22:37   Mr Brain Wo Contrast  08/02/2014   CLINICAL DATA:  Initial evaluation for acute dysarthria.  EXAM: MRI HEAD WITHOUT CONTRAST  MRA HEAD WITHOUT CONTRAST  TECHNIQUE: Multiplanar, multiecho pulse sequences of the brain and surrounding structures were obtained without intravenous contrast. Angiographic images of the head were obtained using MRA technique without contrast.  COMPARISON:  None.  FINDINGS: MRI HEAD FINDINGS  Curvilinear focus of restricted diffusion measuring 13 x 4 x 14 mm present within the left lentiform nucleus with superior extension into the periventricular white matter of the left corona radiata, compatible with acute ischemic nonhemorrhagic infarct. No associated mass effect or hemorrhage. No other acute ischemic infarct. Gray-white matter differentiation maintained. Normal intravascular flow voids are preserved. No acute or chronic intracranial hemorrhage.  No acute or chronic intracranial hemorrhage.  Small remote lacunar infarct present within the  right basal ganglia/right corona radiata. Mild chronic small vessel ischemic changes present within the periventricular white matter. Mild age-related cerebral atrophy.  No mass lesion, midline shift, or mass effect. No hydrocephalus. No extra-axial fluid collection.  Craniocervical junction within normal limits. Pituitary gland normal. No acute abnormality about the orbits.  Minimal mucosal thickening within the ethmoidal air cells. Paranasal sinuses are otherwise clear. No mastoid effusion. Inner ear structures grossly normal.  Bone marrow signal intensity within normal limits. Scalp soft tissues unremarkable.  MRA HEAD FINDINGS  ANTERIOR CIRCULATION:  Visualized distal cervical segments of the internal carotid arteries are widely patent with antegrade flow. The petrous segments are widely patent. Minimal atheromatous irregularity within the cavernous right ICA without significant stenosis. Cavernous left ICA widely patent. Supraclinoid segments widely patent. A1 segments, anterior communicating artery, anterior cerebral arteries widely patent. M1 segments widely patent without stenosis or occlusion. MCA bifurcations within normal limits. Distal MCA branches symmetric and well opacified bilaterally.  POSTERIOR CIRCULATION:  Vertebral arteries are widely patent to the vertebrobasilar junction. Posterior inferior cerebral arteries  patent bilaterally. Basilar artery well opacified and widely patent. Right anterior inferior cerebral artery is dominant. Superior cerebellar arteries patent bilaterally. Both posterior cerebral arteries arise from the basilar artery and are well opacified without stenosis or occlusion.  No aneurysm or vascular malformation.  IMPRESSION: MRI HEAD IMPRESSION:  1. Acute ischemic nonhemorrhagic infarct involving the left lentiform nucleus with superior extension into the periventricular white matter of the left corona radiata. 2. Remote lacunar type infarction within the right basal  ganglia/corona radiata. 3. Mild chronic microvascular ischemic disease.  MRA HEAD IMPRESSION:  Normal MRA of the intracranial circulation. No early or proximal arterial branch occlusion identified. No high grade or correctable stenosis.   Electronically Signed   By: Jeannine Boga M.D.   On: 08/02/2014 22:22   Mr Jodene Nam Head/brain Wo Cm  08/02/2014   CLINICAL DATA:  Initial evaluation for acute dysarthria.  EXAM: MRI HEAD WITHOUT CONTRAST  MRA HEAD WITHOUT CONTRAST  TECHNIQUE: Multiplanar, multiecho pulse sequences of the brain and surrounding structures were obtained without intravenous contrast. Angiographic images of the head were obtained using MRA technique without contrast.  COMPARISON:  None.  FINDINGS: MRI HEAD FINDINGS  Curvilinear focus of restricted diffusion measuring 13 x 4 x 14 mm present within the left lentiform nucleus with superior extension into the periventricular white matter of the left corona radiata, compatible with acute ischemic nonhemorrhagic infarct. No associated mass effect or hemorrhage. No other acute ischemic infarct. Gray-white matter differentiation maintained. Normal intravascular flow voids are preserved. No acute or chronic intracranial hemorrhage.  No acute or chronic intracranial hemorrhage.  Small remote lacunar infarct present within the right basal ganglia/right corona radiata. Mild chronic small vessel ischemic changes present within the periventricular white matter. Mild age-related cerebral atrophy.  No mass lesion, midline shift, or mass effect. No hydrocephalus. No extra-axial fluid collection.  Craniocervical junction within normal limits. Pituitary gland normal. No acute abnormality about the orbits.  Minimal mucosal thickening within the ethmoidal air cells. Paranasal sinuses are otherwise clear. No mastoid effusion. Inner ear structures grossly normal.  Bone marrow signal intensity within normal limits. Scalp soft tissues unremarkable.  MRA HEAD FINDINGS   ANTERIOR CIRCULATION:  Visualized distal cervical segments of the internal carotid arteries are widely patent with antegrade flow. The petrous segments are widely patent. Minimal atheromatous irregularity within the cavernous right ICA without significant stenosis. Cavernous left ICA widely patent. Supraclinoid segments widely patent. A1 segments, anterior communicating artery, anterior cerebral arteries widely patent. M1 segments widely patent without stenosis or occlusion. MCA bifurcations within normal limits. Distal MCA branches symmetric and well opacified bilaterally.  POSTERIOR CIRCULATION:  Vertebral arteries are widely patent to the vertebrobasilar junction. Posterior inferior cerebral arteries patent bilaterally. Basilar artery well opacified and widely patent. Right anterior inferior cerebral artery is dominant. Superior cerebellar arteries patent bilaterally. Both posterior cerebral arteries arise from the basilar artery and are well opacified without stenosis or occlusion.  No aneurysm or vascular malformation.  IMPRESSION: MRI HEAD IMPRESSION:  1. Acute ischemic nonhemorrhagic infarct involving the left lentiform nucleus with superior extension into the periventricular white matter of the left corona radiata. 2. Remote lacunar type infarction within the right basal ganglia/corona radiata. 3. Mild chronic microvascular ischemic disease.  MRA HEAD IMPRESSION:  Normal MRA of the intracranial circulation. No early or proximal arterial branch occlusion identified. No high grade or correctable stenosis.   Electronically Signed   By: Jeannine Boga M.D.   On: 08/02/2014 22:22    Microbiology: No results  found for this or any previous visit (from the past 240 hour(s)).   Labs: Basic Metabolic Panel:  Recent Labs Lab 08/02/14 2021  CREATININE 0.76   Liver Function Tests: No results for input(s): AST, ALT, ALKPHOS, BILITOT, PROT, ALBUMIN in the last 168 hours. No results for input(s):  LIPASE, AMYLASE in the last 168 hours. No results for input(s): AMMONIA in the last 168 hours. CBC:  Recent Labs Lab 08/02/14 2021  WBC 4.4  HGB 14.2  HCT 42.6  MCV 84.7  PLT 232   Cardiac Enzymes: No results for input(s): CKTOTAL, CKMB, CKMBINDEX, TROPONINI in the last 168 hours. BNP: BNP (last 3 results) No results for input(s): BNP in the last 8760 hours.  ProBNP (last 3 results) No results for input(s): PROBNP in the last 8760 hours.  CBG:  Recent Labs Lab 08/04/14 1137 08/04/14 1621 08/04/14 2149 08/05/14 0621 08/05/14 1121  GLUCAP 271* 169* 171* 130* 230*       Signed:  VANN, JESSICA  [facility] 08/05/2014, 2:05 PM

## 2014-08-05 NOTE — Progress Notes (Signed)
Pt for discharge home today. Discharge orders received. IV and telemetry dcd with dressing clean dry and intact. Discharge instructions and prescriptions given with verbalized understanding. [facility] referral made.[facility] will follow up.Staff brought patient to front lobby via wheelchair at 1600.Transported to home in [place] by son.

## 2014-08-05 NOTE — Progress Notes (Signed)
Inpatient Diabetes Program Recommendations  AACE/ADA: New Consensus Statement on Inpatient Glycemic Control (2013)  Target Ranges:  Prepandial:   less than 140 mg/dL      Peak postprandial:   less than 180 mg/dL (1-2 hours)      Critically ill patients:  140 - 180 mg/dL   Results for SIRRON, FRANCESCONI (MRN [id number]) as of 08/05/2014 11:19  Ref. Range 03/09/2014 16:57 08/02/2014 20:21 08/03/2014 06:13  Hemoglobin A1C Latest Ref Range: 4.8-5.6 % 13.1 (H) 14.7 (H) 15.4 (H)   This coordinator spoke with patient concerning DM management at home.  Pt expresses understanding of healthy eating and carb content in foods.  Pt states he is consuming large amounts of food at buffet style restaurants [facility]) and is not gaining weight.  States he was trying to gain weight but has not been successful.  Pt states he goes out to eat to socialize.  This coordinator encouraged socializing and researching local community activities.   Pt states he has a glucose meter but has not been monitoring his glucose at home.  Encouraged pt to monitor CBGs regularly at home  and to check strip expiration dates.  Pt likely needs insulin but does not seem open to it at this time.  Discussed diabetes pathophysiology and explained how insulin is often needed even with type-2 DM.  His A1C has been steadily rising since March 2016.   No further questions/concerns at the end of our visit.  Pt will follow-up with his PCP concerning DM management. Thank you  Piedad Climes BSN, RN,CDE Inpatient Diabetes Coordinator [phone] (team pager)

## 2014-08-06 ENCOUNTER — Telehealth: Payer: Self-pay

## 2014-08-06 DIAGNOSIS — I10 Essential (primary) hypertension: Secondary | ICD-10-CM | POA: Diagnosis not present

## 2014-08-06 DIAGNOSIS — E119 Type 2 diabetes mellitus without complications: Secondary | ICD-10-CM | POA: Diagnosis not present

## 2014-08-06 DIAGNOSIS — I69351 Hemiplegia and hemiparesis following cerebral infarction affecting right dominant side: Secondary | ICD-10-CM | POA: Diagnosis not present

## 2014-08-06 DIAGNOSIS — K219 Gastro-esophageal reflux disease without esophagitis: Secondary | ICD-10-CM | POA: Diagnosis not present

## 2014-08-06 DIAGNOSIS — I69322 Dysarthria following cerebral infarction: Secondary | ICD-10-CM | POA: Diagnosis not present

## 2014-08-06 DIAGNOSIS — F329 Major depressive disorder, single episode, unspecified: Secondary | ICD-10-CM | POA: Diagnosis not present

## 2014-08-06 NOTE — Telephone Encounter (Signed)
TCM list DX: dysarthria  Recommended that pt has full eval with neurology for cognitive ability and that pt should be placed into SNF but does not qualify.   LVM for pt to call back as soon as possible.   RE: TCM call

## 2014-08-09 ENCOUNTER — Telehealth: Payer: Self-pay | Admitting: Internal Medicine

## 2014-08-09 ENCOUNTER — Other Ambulatory Visit: Payer: Self-pay | Admitting: *Deleted

## 2014-08-09 DIAGNOSIS — F329 Major depressive disorder, single episode, unspecified: Secondary | ICD-10-CM | POA: Diagnosis not present

## 2014-08-09 DIAGNOSIS — I639 Cerebral infarction, unspecified: Secondary | ICD-10-CM

## 2014-08-09 DIAGNOSIS — I69351 Hemiplegia and hemiparesis following cerebral infarction affecting right dominant side: Secondary | ICD-10-CM | POA: Diagnosis not present

## 2014-08-09 DIAGNOSIS — K219 Gastro-esophageal reflux disease without esophagitis: Secondary | ICD-10-CM | POA: Diagnosis not present

## 2014-08-09 DIAGNOSIS — E119 Type 2 diabetes mellitus without complications: Secondary | ICD-10-CM | POA: Diagnosis not present

## 2014-08-09 DIAGNOSIS — I10 Essential (primary) hypertension: Secondary | ICD-10-CM | POA: Diagnosis not present

## 2014-08-09 DIAGNOSIS — E1140 Type 2 diabetes mellitus with diabetic neuropathy, unspecified: Secondary | ICD-10-CM

## 2014-08-09 DIAGNOSIS — I69322 Dysarthria following cerebral infarction: Secondary | ICD-10-CM | POA: Diagnosis not present

## 2014-08-09 MED ORDER — BAYER MICROLET LANCETS MISC: 1.0000 | Freq: Two times a day (BID) | Status: DC

## 2014-08-09 MED ORDER — GLUCOSE BLOOD VI STRP: 1.0000 | ORAL_STRIP | Freq: Two times a day (BID) | Status: DC

## 2014-08-09 NOTE — Telephone Encounter (Signed)
States she completed start of care on Friday.  She is requesting Dr. Jonny Ruiz to sign orders for a nutrition consult for diabetes management, PT and OT.

## 2014-08-09 NOTE — Telephone Encounter (Signed)
Received fax pt is now using Contour Monitor needing strips nad lancets sent pharmacy. Updated med list rx has been sent...Raechel Chute

## 2014-08-09 NOTE — Telephone Encounter (Signed)
Patient has called you back.  Please call patient back tomorrow

## 2014-08-09 NOTE — Telephone Encounter (Signed)
LVM for pt to call back as soon as possible.   RE: pt is on TCM list.   This is the 2nd call in the last two business days.

## 2014-08-09 NOTE — Telephone Encounter (Signed)
Md not in office today will hold until he return tomorrow...Raechel Chute

## 2014-08-10 ENCOUNTER — Telehealth: Payer: Self-pay | Admitting: Internal Medicine

## 2014-08-10 DIAGNOSIS — I69351 Hemiplegia and hemiparesis following cerebral infarction affecting right dominant side: Secondary | ICD-10-CM | POA: Diagnosis not present

## 2014-08-10 DIAGNOSIS — I69322 Dysarthria following cerebral infarction: Secondary | ICD-10-CM | POA: Diagnosis not present

## 2014-08-10 DIAGNOSIS — K219 Gastro-esophageal reflux disease without esophagitis: Secondary | ICD-10-CM | POA: Diagnosis not present

## 2014-08-10 DIAGNOSIS — I10 Essential (primary) hypertension: Secondary | ICD-10-CM | POA: Diagnosis not present

## 2014-08-10 DIAGNOSIS — E119 Type 2 diabetes mellitus without complications: Secondary | ICD-10-CM | POA: Diagnosis not present

## 2014-08-10 DIAGNOSIS — F329 Major depressive disorder, single episode, unspecified: Secondary | ICD-10-CM | POA: Diagnosis not present

## 2014-08-10 NOTE — Telephone Encounter (Signed)
Authorization given, last OV note faxed

## 2014-08-10 NOTE — Telephone Encounter (Signed)
Noted  Pt to consider OV to address DM management

## 2014-08-10 NOTE — Telephone Encounter (Signed)
Referral done to both

## 2014-08-10 NOTE — Telephone Encounter (Signed)
Jake Chen [phone] [facility]   Verbal order to continue home health services   Would also like the last office number faxed over  [phone] fax number

## 2014-08-10 NOTE — Telephone Encounter (Signed)
DX: dysarthria.  *Note to PCP: Pt dc summary states Levemir. Pt stated that they are not taking levemir and that they are reluctant to using insulin.   Transition Care Management Follow-up Telephone Call  Date discharged? 08/06/14  How have you been since you were released from the hospital? Okay per pt.   Do you understand why you were in the hospital? yes  Do you understand the discharge instructions? yes  Where were you discharged to? HOME Items Reviewed:  Medications reviewed: yes  Allergies reviewed: yes  Dietary changes reviewed: yes dietary changes were reviewed but pt requests a nutritionist for diabetes management.   Referrals reviewed: [facility] referral done.  Functional Questionnaire:   Activities of Daily Living (ADLs):   States they are independent in the following: All ADLs States they require assistance with the following: pt is able to ambulate and perform ADLs just as pt was to before hospital admission.   Any transportation issues/concerns?: none  Any patient concerns? none  Confirmed importance and date/time of follow-up visits scheduled:  Provider Appointment booked with PCP Dr. Jonny Ruiz for 08/12/14 at 5pm  Confirmed with patient if condition begins to worsen call PCP or go to the ER.  Patient was given the office number and encouraged to call back with question or concerns: Pt stated understanding.

## 2014-08-11 DIAGNOSIS — K219 Gastro-esophageal reflux disease without esophagitis: Secondary | ICD-10-CM | POA: Diagnosis not present

## 2014-08-11 DIAGNOSIS — I69322 Dysarthria following cerebral infarction: Secondary | ICD-10-CM | POA: Diagnosis not present

## 2014-08-11 DIAGNOSIS — E119 Type 2 diabetes mellitus without complications: Secondary | ICD-10-CM | POA: Diagnosis not present

## 2014-08-11 DIAGNOSIS — I69351 Hemiplegia and hemiparesis following cerebral infarction affecting right dominant side: Secondary | ICD-10-CM | POA: Diagnosis not present

## 2014-08-11 DIAGNOSIS — F329 Major depressive disorder, single episode, unspecified: Secondary | ICD-10-CM | POA: Diagnosis not present

## 2014-08-11 DIAGNOSIS — I10 Essential (primary) hypertension: Secondary | ICD-10-CM | POA: Diagnosis not present

## 2014-08-11 NOTE — Telephone Encounter (Signed)
He has a TCM appt with you this week.

## 2014-08-11 NOTE — Telephone Encounter (Signed)
tcm appt this week.

## 2014-08-12 ENCOUNTER — Telehealth: Payer: Self-pay | Admitting: Internal Medicine

## 2014-08-12 ENCOUNTER — Ambulatory Visit (INDEPENDENT_AMBULATORY_CARE_PROVIDER_SITE_OTHER): Payer: Medicare Other | Admitting: Internal Medicine

## 2014-08-12 ENCOUNTER — Encounter: Payer: Self-pay | Admitting: Internal Medicine

## 2014-08-12 VITALS — BP 108/62 | HR 67 | Temp 97.5°F | Ht 72.0 in | Wt 143.0 lb

## 2014-08-12 DIAGNOSIS — I6339 Cerebral infarction due to thrombosis of other cerebral artery: Secondary | ICD-10-CM

## 2014-08-12 DIAGNOSIS — E119 Type 2 diabetes mellitus without complications: Secondary | ICD-10-CM | POA: Diagnosis not present

## 2014-08-12 DIAGNOSIS — E1165 Type 2 diabetes mellitus with hyperglycemia: Secondary | ICD-10-CM | POA: Diagnosis not present

## 2014-08-12 DIAGNOSIS — I10 Essential (primary) hypertension: Secondary | ICD-10-CM | POA: Diagnosis not present

## 2014-08-12 DIAGNOSIS — I69351 Hemiplegia and hemiparesis following cerebral infarction affecting right dominant side: Secondary | ICD-10-CM | POA: Diagnosis not present

## 2014-08-12 DIAGNOSIS — IMO0001 Reserved for inherently not codable concepts without codable children: Secondary | ICD-10-CM

## 2014-08-12 DIAGNOSIS — F329 Major depressive disorder, single episode, unspecified: Secondary | ICD-10-CM | POA: Diagnosis not present

## 2014-08-12 DIAGNOSIS — I69322 Dysarthria following cerebral infarction: Secondary | ICD-10-CM | POA: Diagnosis not present

## 2014-08-12 DIAGNOSIS — K219 Gastro-esophageal reflux disease without esophagitis: Secondary | ICD-10-CM | POA: Diagnosis not present

## 2014-08-12 MED ORDER — ONETOUCH ULTRA 2 W/DEVICE KIT: PACK | Status: DC

## 2014-08-12 MED ORDER — GLUCOSE BLOOD VI STRP: ORAL_STRIP | Status: DC

## 2014-08-12 MED ORDER — PIOGLITAZONE HCL 45 MG PO TABS: 45.0000 mg | ORAL_TABLET | Freq: Every day | ORAL | Status: DC

## 2014-08-12 NOTE — Assessment & Plan Note (Signed)
With some sympt improvement, no worsening neuro deficit, cont asa/statin, PT/ST

## 2014-08-12 NOTE — Telephone Encounter (Signed)
Verbal authorization given per MD protocol 

## 2014-08-12 NOTE — Assessment & Plan Note (Signed)
Severe, refuses any injectable meds, to add actos 45 mg, cont all other med, f/u 3 mo

## 2014-08-12 NOTE — Patient Instructions (Signed)
Please take all new medication as prescribed - the actos at 45 mg per day  Please continue all other medications as before, and refills have been done if requested, including the prescription for the new glucometer and strips  Please have the pharmacy call with any other refills you may need.  Please keep your appointments with your specialists as you may have planned  Please return in 3 months, or sooner if needed

## 2014-08-12 NOTE — Assessment & Plan Note (Signed)
stable overall by history and exam, recent data reviewed with pt, and pt to continue medical treatment as before,  to f/u any worsening symptoms or concerns BP Readings from Last 3 Encounters:  08/12/14 108/62  08/05/14 141/82  03/09/14 128/82

## 2014-08-12 NOTE — Progress Notes (Signed)
Pre visit review using our clinic review tool, if applicable. No additional management support is needed unless otherwise documented below in the visit note. 

## 2014-08-12 NOTE — Progress Notes (Signed)
Subjective:    Patient ID: Jake Chen, male    DOB: 1950/06/28, 64 y.o.   MRN: [id number]  HPI  Here to f/u; overall doing ok,  Pt denies chest pain, increasing sob or doe, wheezing, orthopnea, PND, increased LE swelling, palpitations, dizziness or syncope.  Pt denies new neurological symptoms such as new headache, or facial or extremity weakness or numbness.  Pt denies polydipsia, polyuria, or low sugar episode.   Pt denies new neurological symptoms such as new headache, or facial or extremity weakness or numbness.   Pt states overall good compliance with meds, mostly trying to follow appropriate diet, with wt overall stable,  [facility] seeing post CVA  - right side weakness seems to be improving per pt.  Speech therapy seeing as well, last visit due next wk.  Passed OT eval, no need further services, PT seeing at least 2 more times.  Home glucometer not working, needs supplies.  Tolerating lipitor and asa 325, refuses insulin, declines dietician.  Past Medical History  Diagnosis Date  . Depression 01/26/2011  . Erectile dysfunction 01/26/2011  . 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 Anxiety and depression 07/19/2011  . DIABETES MELLITUS, TYPE II 12/24/2006    Qualifier: Diagnosis of  By: Jonny Ruiz MD, Len Blalock   . HYPERLIPIDEMIA 12/24/2006    Qualifier: Diagnosis of  By: Jonny Ruiz MD, Len Blalock   . HYPERTENSION 12/24/2006    Qualifier: Diagnosis of  By: Jonny Ruiz MD, Len Blalock   . PSA, INCREASED 12/25/2006    Qualifier: Diagnosis of  By: Jonny Ruiz MD, Len Blalock   . Diabetes 03/09/2014   No past surgical history on file.  reports that he has never smoked. He has never used smokeless tobacco. He reports that he does not drink alcohol or use illicit drugs. family history includes Cancer in his father and mother. No Known Allergies Current Outpatient Prescriptions on File Prior to Visit  Medication Sig Dispense Refill  . aspirin 325 MG tablet  Take 1 tablet (325 mg total) by mouth daily. 30 tablet 0  . atorvastatin (LIPITOR) 40 MG tablet Take 1 tablet (40 mg total) by mouth daily at 6 PM. 30 tablet 0  . BAYER MICROLET LANCETS lancets 1 each by Other route 2 (two) times daily. Use to help check blood sugars twice a day Dx E11.65 180 each 3  . glipiZIDE (GLUCOTROL) 10 MG tablet Take 1 tablet (10 mg total) by mouth 2 (two) times daily before a meal. 60 tablet 11  . metFORMIN (GLUCOPHAGE-XR) 500 MG 24 hr tablet Take 2 tablets (1,000 mg total) by mouth daily with breakfast. 120 tablet 11   No current facility-administered medications on file prior to visit.    Review of Systems  Constitutional: Negative for unusual diaphoresis or night sweats HENT: Negative for ringing in ear or discharge Eyes: Negative for double vision or worsening visual disturbance.  Respiratory: Negative for choking and stridor.   Gastrointestinal: Negative for vomiting or other signifcant bowel change Genitourinary: Negative for hematuria or change in urine volume.  Musculoskeletal: Negative for other MSK pain or swelling Skin: Negative for color change and worsening wound.  Neurological: Negative for tremors and numbness other than noted  Psychiatric/Behavioral: Negative for decreased concentration or agitation other than above       Objective:   Physical Exam BP 108/62 mmHg  Pulse 67  Temp(Src) 97.5 F (36.4  C) (Oral)  Ht 6' (1.829 m)  Wt 143 lb (64.864 kg)  BMI 19.39 kg/m2  SpO2 98% VS noted,  Constitutional: Pt appears in no significant distress HENT: Head: NCAT.  Right Ear: External ear normal.  Left Ear: External ear normal.  Eyes: . Pupils are equal, round, and reactive to light. Conjunctivae and EOM are normal Neck: Normal range of motion. Neck supple.  Cardiovascular: Normal rate and regular rhythm.   Pulmonary/Chest: Effort normal and breath sounds without rales or wheezing.  Abd:  Soft, NT, ND, + BS Neurological: Pt is alert. Not  confused , motor 4+/5 RUE only, o/w intact Skin: Skin is warm. No rash, no LE edema Psychiatric: Pt behavior is normal. No agitation.     Assessment & Plan:

## 2014-08-12 NOTE — Telephone Encounter (Signed)
Joyce Gross is calling to request 1 additional visit next week to set up a home exercise program. His arthrea is resolving. He is very intelligible. He has some imprecision on some of the sounds in his speech. She is requesting a verbal

## 2014-08-13 ENCOUNTER — Other Ambulatory Visit: Payer: Self-pay

## 2014-08-13 MED ORDER — GLUCOSE BLOOD VI STRP: ORAL_STRIP | Status: DC

## 2014-08-16 DIAGNOSIS — F329 Major depressive disorder, single episode, unspecified: Secondary | ICD-10-CM | POA: Diagnosis not present

## 2014-08-16 DIAGNOSIS — I69351 Hemiplegia and hemiparesis following cerebral infarction affecting right dominant side: Secondary | ICD-10-CM | POA: Diagnosis not present

## 2014-08-16 DIAGNOSIS — E119 Type 2 diabetes mellitus without complications: Secondary | ICD-10-CM | POA: Diagnosis not present

## 2014-08-16 DIAGNOSIS — K219 Gastro-esophageal reflux disease without esophagitis: Secondary | ICD-10-CM | POA: Diagnosis not present

## 2014-08-16 DIAGNOSIS — I10 Essential (primary) hypertension: Secondary | ICD-10-CM | POA: Diagnosis not present

## 2014-08-16 DIAGNOSIS — I69322 Dysarthria following cerebral infarction: Secondary | ICD-10-CM | POA: Diagnosis not present

## 2014-08-19 DIAGNOSIS — I69322 Dysarthria following cerebral infarction: Secondary | ICD-10-CM | POA: Diagnosis not present

## 2014-08-19 DIAGNOSIS — E119 Type 2 diabetes mellitus without complications: Secondary | ICD-10-CM | POA: Diagnosis not present

## 2014-08-19 DIAGNOSIS — F329 Major depressive disorder, single episode, unspecified: Secondary | ICD-10-CM | POA: Diagnosis not present

## 2014-08-19 DIAGNOSIS — K219 Gastro-esophageal reflux disease without esophagitis: Secondary | ICD-10-CM | POA: Diagnosis not present

## 2014-08-19 DIAGNOSIS — I10 Essential (primary) hypertension: Secondary | ICD-10-CM | POA: Diagnosis not present

## 2014-08-19 DIAGNOSIS — I69351 Hemiplegia and hemiparesis following cerebral infarction affecting right dominant side: Secondary | ICD-10-CM | POA: Diagnosis not present

## 2014-08-20 DIAGNOSIS — I69351 Hemiplegia and hemiparesis following cerebral infarction affecting right dominant side: Secondary | ICD-10-CM | POA: Diagnosis not present

## 2014-08-23 DIAGNOSIS — F329 Major depressive disorder, single episode, unspecified: Secondary | ICD-10-CM | POA: Diagnosis not present

## 2014-08-23 DIAGNOSIS — I10 Essential (primary) hypertension: Secondary | ICD-10-CM | POA: Diagnosis not present

## 2014-08-23 DIAGNOSIS — K219 Gastro-esophageal reflux disease without esophagitis: Secondary | ICD-10-CM | POA: Diagnosis not present

## 2014-08-23 DIAGNOSIS — I69351 Hemiplegia and hemiparesis following cerebral infarction affecting right dominant side: Secondary | ICD-10-CM | POA: Diagnosis not present

## 2014-08-23 DIAGNOSIS — I69322 Dysarthria following cerebral infarction: Secondary | ICD-10-CM | POA: Diagnosis not present

## 2014-08-23 DIAGNOSIS — E119 Type 2 diabetes mellitus without complications: Secondary | ICD-10-CM | POA: Diagnosis not present

## 2014-08-25 DIAGNOSIS — E119 Type 2 diabetes mellitus without complications: Secondary | ICD-10-CM | POA: Diagnosis not present

## 2014-08-25 DIAGNOSIS — K219 Gastro-esophageal reflux disease without esophagitis: Secondary | ICD-10-CM | POA: Diagnosis not present

## 2014-08-25 DIAGNOSIS — F329 Major depressive disorder, single episode, unspecified: Secondary | ICD-10-CM | POA: Diagnosis not present

## 2014-08-25 DIAGNOSIS — I69351 Hemiplegia and hemiparesis following cerebral infarction affecting right dominant side: Secondary | ICD-10-CM | POA: Diagnosis not present

## 2014-08-25 DIAGNOSIS — I69322 Dysarthria following cerebral infarction: Secondary | ICD-10-CM | POA: Diagnosis not present

## 2014-08-25 DIAGNOSIS — I10 Essential (primary) hypertension: Secondary | ICD-10-CM | POA: Diagnosis not present

## 2014-08-26 DIAGNOSIS — E119 Type 2 diabetes mellitus without complications: Secondary | ICD-10-CM | POA: Diagnosis not present

## 2014-08-26 DIAGNOSIS — K219 Gastro-esophageal reflux disease without esophagitis: Secondary | ICD-10-CM | POA: Diagnosis not present

## 2014-08-26 DIAGNOSIS — I69351 Hemiplegia and hemiparesis following cerebral infarction affecting right dominant side: Secondary | ICD-10-CM | POA: Diagnosis not present

## 2014-08-26 DIAGNOSIS — F329 Major depressive disorder, single episode, unspecified: Secondary | ICD-10-CM | POA: Diagnosis not present

## 2014-08-26 DIAGNOSIS — I69322 Dysarthria following cerebral infarction: Secondary | ICD-10-CM | POA: Diagnosis not present

## 2014-08-26 DIAGNOSIS — I10 Essential (primary) hypertension: Secondary | ICD-10-CM | POA: Diagnosis not present

## 2014-08-27 DIAGNOSIS — I69351 Hemiplegia and hemiparesis following cerebral infarction affecting right dominant side: Secondary | ICD-10-CM

## 2014-09-03 ENCOUNTER — Telehealth: Payer: Self-pay | Admitting: *Deleted

## 2014-09-03 DIAGNOSIS — I69322 Dysarthria following cerebral infarction: Secondary | ICD-10-CM | POA: Diagnosis not present

## 2014-09-03 DIAGNOSIS — E119 Type 2 diabetes mellitus without complications: Secondary | ICD-10-CM | POA: Diagnosis not present

## 2014-09-03 DIAGNOSIS — K219 Gastro-esophageal reflux disease without esophagitis: Secondary | ICD-10-CM | POA: Diagnosis not present

## 2014-09-03 DIAGNOSIS — I10 Essential (primary) hypertension: Secondary | ICD-10-CM | POA: Diagnosis not present

## 2014-09-03 DIAGNOSIS — F329 Major depressive disorder, single episode, unspecified: Secondary | ICD-10-CM | POA: Diagnosis not present

## 2014-09-03 DIAGNOSIS — I69351 Hemiplegia and hemiparesis following cerebral infarction affecting right dominant side: Secondary | ICD-10-CM | POA: Diagnosis not present

## 2014-09-03 MED ORDER — ATORVASTATIN CALCIUM 40 MG PO TABS: 40.0000 mg | ORAL_TABLET | Freq: Every day | ORAL | Status: DC

## 2014-09-03 NOTE — Telephone Encounter (Signed)
Receive call pt is needing refill on his astorvastatin verified pharmacy sent to [facility] in [place]...Raechel Chute

## 2014-09-10 DIAGNOSIS — F329 Major depressive disorder, single episode, unspecified: Secondary | ICD-10-CM | POA: Diagnosis not present

## 2014-09-10 DIAGNOSIS — E119 Type 2 diabetes mellitus without complications: Secondary | ICD-10-CM | POA: Diagnosis not present

## 2014-09-10 DIAGNOSIS — I10 Essential (primary) hypertension: Secondary | ICD-10-CM | POA: Diagnosis not present

## 2014-09-10 DIAGNOSIS — K219 Gastro-esophageal reflux disease without esophagitis: Secondary | ICD-10-CM | POA: Diagnosis not present

## 2014-09-10 DIAGNOSIS — I69322 Dysarthria following cerebral infarction: Secondary | ICD-10-CM | POA: Diagnosis not present

## 2014-09-10 DIAGNOSIS — I69351 Hemiplegia and hemiparesis following cerebral infarction affecting right dominant side: Secondary | ICD-10-CM | POA: Diagnosis not present

## 2014-09-17 DIAGNOSIS — I69351 Hemiplegia and hemiparesis following cerebral infarction affecting right dominant side: Secondary | ICD-10-CM | POA: Diagnosis not present

## 2014-09-17 DIAGNOSIS — E119 Type 2 diabetes mellitus without complications: Secondary | ICD-10-CM | POA: Diagnosis not present

## 2014-09-17 DIAGNOSIS — K219 Gastro-esophageal reflux disease without esophagitis: Secondary | ICD-10-CM | POA: Diagnosis not present

## 2014-09-17 DIAGNOSIS — I10 Essential (primary) hypertension: Secondary | ICD-10-CM | POA: Diagnosis not present

## 2014-09-17 DIAGNOSIS — I69322 Dysarthria following cerebral infarction: Secondary | ICD-10-CM | POA: Diagnosis not present

## 2014-09-17 DIAGNOSIS — F329 Major depressive disorder, single episode, unspecified: Secondary | ICD-10-CM | POA: Diagnosis not present

## 2014-09-28 ENCOUNTER — Telehealth: Payer: Self-pay | Admitting: *Deleted

## 2014-09-28 MED ORDER — ONETOUCH LANCETS MISC: Status: DC

## 2014-09-28 NOTE — Telephone Encounter (Signed)
Left msg on triage stating needing prescription sent to [facility] pharmacy in [place] for one touch lancets. Called pt no answer LMOM rx has been sent to pharmacy.....Raechel Chute

## 2014-09-29 DIAGNOSIS — Z23 Encounter for immunization: Secondary | ICD-10-CM | POA: Diagnosis not present

## 2014-10-06 ENCOUNTER — Encounter: Payer: Self-pay | Admitting: Nutrition

## 2014-10-06 ENCOUNTER — Encounter: Payer: Medicare Other | Attending: Internal Medicine | Admitting: Nutrition

## 2014-10-06 ENCOUNTER — Telehealth: Payer: Self-pay | Admitting: Internal Medicine

## 2014-10-06 VITALS — Ht 72.0 in | Wt 151.0 lb

## 2014-10-06 DIAGNOSIS — E785 Hyperlipidemia, unspecified: Secondary | ICD-10-CM | POA: Insufficient documentation

## 2014-10-06 DIAGNOSIS — E118 Type 2 diabetes mellitus with unspecified complications: Secondary | ICD-10-CM | POA: Diagnosis not present

## 2014-10-06 DIAGNOSIS — E1165 Type 2 diabetes mellitus with hyperglycemia: Secondary | ICD-10-CM

## 2014-10-06 DIAGNOSIS — E1169 Type 2 diabetes mellitus with other specified complication: Secondary | ICD-10-CM | POA: Insufficient documentation

## 2014-10-06 DIAGNOSIS — Z713 Dietary counseling and surveillance: Secondary | ICD-10-CM | POA: Insufficient documentation

## 2014-10-06 DIAGNOSIS — I639 Cerebral infarction, unspecified: Secondary | ICD-10-CM

## 2014-10-06 DIAGNOSIS — IMO0002 Reserved for concepts with insufficient information to code with codable children: Secondary | ICD-10-CM

## 2014-10-06 MED ORDER — GLUCOSE BLOOD VI STRP: 1.0000 | ORAL_STRIP | Freq: Two times a day (BID) | Status: DC

## 2014-10-06 NOTE — Telephone Encounter (Signed)
Patient is calling needing a refill for glucose blood (ONE TOUCH ULTRA TEST) test strip [[id number]]  He states the prescription needs to read 200 2 x a day to match his lancets prescription for the insurance to pay Pharmacy is [facility] in [place], [place]

## 2014-10-06 NOTE — Progress Notes (Signed)
  Medical Nutrition Therapy:  Appt start time: 0930 End time:  1030.  Assessment:  Primary concerns today: DIabetes. Lives by himelf. Most recent A1C 15% 2-3 months ago. Has had  Dm for 10 years.Had a CVA  Two months ago..  Checks blood sugars twice a day. FBS: 150-200 mg/dl and evening 161-096'E.  Taking Metformin 1000 mg BID, Glipizide 10 mg BID and Actos 45 mg daily.  DIdn't bring meter.   Lost 40 lbs over the last 4 years. Wants to put some weight back on.. Tried insulin for a month or two in 2015 but he said he couldn't do it. S/p CVA and has limited balance when walking and steadiness of hands. Comprehension is good. Active in his church. Doesn't cook much. Eats out lunch and dinner daily. Eats breakfast at home. Still driving. Diet has been high in saturated fat, sodium and simple sugars. Eats snacks and likes ice cream, cookies and chips at night most nights. Willing to change eating habits but reaizes his cravings for sweets at night are going to be hard to change. Diet is excessive to meet his nutritional needs and control his blood sugars.  Preferred Learning Style:  Auditory  Visual  Hands on   Learning Readiness:   Ready  Change in progress   MEDICATIONS: See list.   DIETARY INTAKE:  24-hr recall:  B ( AM):  Wheaties or Special K or Total with 2% milk. [place] sausage -2 or patty, water Snk ( AM):  L ( PM): Chicken salad with tomato on lettuce,, water Snk ( PM): sugar cookies or candy bar. water D ( PM): Hamburger steak with green peppers/onions, macaroni salad 1/2 c, fried squash, water Snk ( PM): Ice cream 1 cup Beverages: water, 2 milk or Diet coke CF or Sprite Zero  Usual physical activity: Walking-30 minutes of walking.  Estimated energy needs: 1800 calories 200 g carbohydrates 135 g protein 50 g fat  Progress Towards Goal(s):  In progress.   Nutritional Diagnosis:  NB-1.1 Food and nutrition-related knowledge deficit As related to DIabetes.  As  evidenced by A1C 15%.    Intervention:  Nutrition and diabetes education provided on low fat low sodium high fiber diet using My Plate, portion sizes, CHO counting, meal planning, target ranges for bloods sugars, signs/symptoms of hyper/hypoglycemia and treatment of both, and importance of diet and medication compliance to reduce risks of complications of DM.  Goal 1. Follow Plate Method 2. Cut out the ice cream 3. Increase fresh fruits and vegetables. 4. Walk 30 minutes daily. 5. Avoid snacks. 6. Get A1C down to 8% in three months.  Teaching Method Utilized:  Visual Auditory Hands on  Handouts given during visit include:  The Plate Method  Meal Planning Card  Diabetes Instructions  Barriers to learning/adherence to lifestyle change: CVA, limited mobility and steadiness of hands.  Demonstrated degree of understanding via:  Teach Back   Monitoring/Evaluation:  Dietary intake, exercise, meal planning, SBG, and body weight in 1 month(s). Would recommend to consider discussing the use of a split dose insulin of 75/25 or long acting insulin to help improve blood sugar control and reduce risk of another cardiovascular event and further complications of DM. May consider checking a CPeptide.

## 2014-10-06 NOTE — Telephone Encounter (Signed)
Resent strips to pharmacy...Raechel Chute

## 2014-10-06 NOTE — Patient Instructions (Signed)
Goal 1. Follow Plate Method 2. Cut out the ice cream and after meal snacks. 3. Increase fresh fruits and vegetables. 4. Walk 30 minutes daily. 5. Avoid snacks. 6. Get A1C down to 8% in three months

## 2014-11-10 ENCOUNTER — Ambulatory Visit: Payer: Medicare Other | Admitting: Nutrition

## 2014-12-06 ENCOUNTER — Telehealth: Payer: Self-pay | Admitting: Internal Medicine

## 2014-12-06 NOTE — Telephone Encounter (Signed)
Patient Name: Jake Chen  DOB: 10-Sep-1950    Initial Comment caller states he has a nose bleed   Nurse Assessment  Nurse: Renaldo FiddlerAdkins, RN, Raynelle FanningJulie Date/Time [name](Eastern Time): 12/06/2014 9:24:04 AM  Confirm and document reason for call. If symptomatic, describe symptoms. ---Caller states he is having a nosebleed. States he has had 3-4 since Thanksgiving. This nosebleed began at 7 am, he has not used direct pressure, instructed to apply now. He believes the problem is the dry air in his home, has been using nasal gel. Caller is alone, denies being light headed or dizzy.  Has the patient traveled out of the country within the last 30 days? ---Not Applicable  Does the patient have any new or worsening symptoms? ---Yes  Will a triage be completed? ---Yes  Related visit to physician within the last 2 weeks? ---No  Does the PT have any chronic conditions? (i.e. diabetes, asthma, etc.) ---Yes  List chronic conditions. ---Diabetes Type 2, Htn  Is this a behavioral health call? ---No     Guidelines    Guideline Title Affirmed Question Affirmed Notes  Nosebleed [1] Large amount of blood has been lost (e.g., 1 cup or 240 ml) AND [2] bleeding now controlled (stopped)    Final Disposition User   See Physician within 24 Hours Renaldo FiddlerAdkins, RN, Raynelle FanningJulie    Comments  Advised Mr. [name] to continue to apply direct pressure and I will call back. States he does not want to come to the office today but would be able to come in on Tuesday. Verbalized understanding of all instructions and information.  Caller states his nose bleed has stopped . Reviewed use of humidifier , increase fluid intake, and proper use of direct pressure . He verbalized understanding and will cb as needed if his sx worsen.   Referrals  REFERRED TO PCP OFFICE   Disagree/Comply: Comply

## 2014-12-07 ENCOUNTER — Ambulatory Visit: Payer: Self-pay | Admitting: Internal Medicine

## 2014-12-08 ENCOUNTER — Telehealth: Payer: Self-pay | Admitting: Internal Medicine

## 2014-12-08 NOTE — Telephone Encounter (Signed)
Patient Name: Jake Chen  DOB: [birth year]    Initial Comment caller states he had a nose bleed this morning   Nurse Assessment  Nurse: Jake GaulStringer, RN, Jake Chen Date/Time Jake Chen(Eastern Time): 12/08/2014 8:26:08 AM  Confirm and document reason for call. If symptomatic, describe symptoms. ---Caller states he is having another nosebleed. Nosebleed happened around 7 am. No longer bleeding. Bought an humidifier and it is not helping. Nose bled for just a few min. Has had 3-4 nosebleeds since Thanksgiving.  Has the patient traveled out of the country within the last 30 days? ---Not Applicable  Does the patient have any new or worsening symptoms? ---Yes  Will a triage be completed? ---Yes  Related visit to physician within the last 2 weeks? ---No  Does the PT have any chronic conditions? (i.e. diabetes, asthma, etc.) ---Yes  List chronic conditions. ---HTN; diabetes type 2  Is this a behavioral health call? ---No     Guidelines    Guideline Title Affirmed Question Affirmed Notes  Nosebleed [1] Mild-moderate nosebleed AND [2] bleeding stopped now (all triage questions negative)    Final Disposition User   [facility] HonomuStringer, RN, Jake Chen    Comments  Caller would like to know if he should continue his ASA 325 mg daily as he has had 3-4 nosebleeds since Thanksgiving. Please call pt back and let him know.   Disagree/Comply: Comply

## 2014-12-08 NOTE — Telephone Encounter (Signed)
VM left regarding same on personal VM, pt to call back with any further questions

## 2014-12-08 NOTE — Telephone Encounter (Signed)
Ok to hold for 2 days, then re-start  If bleeding recurs, should go to ER for packing if does not stop, and should probably be seen anyway by ENT, just let me know if he is ok for referral

## 2014-12-13 ENCOUNTER — Telehealth: Payer: Self-pay | Admitting: Internal Medicine

## 2014-12-13 NOTE — Telephone Encounter (Signed)
Pt called in and and just fyi , he has not had any nose bleeds in 5 days.  He has stop the Asprin

## 2014-12-22 ENCOUNTER — Encounter: Payer: Self-pay | Admitting: Internal Medicine

## 2015-01-22 ENCOUNTER — Emergency Department (HOSPITAL_COMMUNITY): Payer: Medicare Other

## 2015-01-22 ENCOUNTER — Emergency Department (HOSPITAL_COMMUNITY)
Admission: EM | Admit: 2015-01-22 | Discharge: 2015-01-22 | Disposition: A | Discharge status: Home / Self Care | Payer: Medicare Other | Attending: Emergency Medicine | Admitting: Emergency Medicine

## 2015-01-22 ENCOUNTER — Encounter (HOSPITAL_COMMUNITY): Payer: Self-pay | Admitting: Emergency Medicine

## 2015-01-22 DIAGNOSIS — S2242XA Multiple fractures of ribs, left side, initial encounter for closed fracture: Secondary | ICD-10-CM | POA: Insufficient documentation

## 2015-01-22 DIAGNOSIS — Z7982 Long term (current) use of aspirin: Secondary | ICD-10-CM | POA: Insufficient documentation

## 2015-01-22 DIAGNOSIS — Z7984 Long term (current) use of oral hypoglycemic drugs: Secondary | ICD-10-CM | POA: Diagnosis not present

## 2015-01-22 DIAGNOSIS — W108XXA Fall (on) (from) other stairs and steps, initial encounter: Secondary | ICD-10-CM | POA: Insufficient documentation

## 2015-01-22 DIAGNOSIS — Z79899 Other long term (current) drug therapy: Secondary | ICD-10-CM | POA: Diagnosis not present

## 2015-01-22 DIAGNOSIS — S2232XA Fracture of one rib, left side, initial encounter for closed fracture: Secondary | ICD-10-CM

## 2015-01-22 DIAGNOSIS — E785 Hyperlipidemia, unspecified: Secondary | ICD-10-CM | POA: Diagnosis not present

## 2015-01-22 DIAGNOSIS — Y9289 Other specified places as the place of occurrence of the external cause: Secondary | ICD-10-CM | POA: Diagnosis not present

## 2015-01-22 DIAGNOSIS — I10 Essential (primary) hypertension: Secondary | ICD-10-CM | POA: Insufficient documentation

## 2015-01-22 DIAGNOSIS — Z87448 Personal history of other diseases of urinary system: Secondary | ICD-10-CM | POA: Insufficient documentation

## 2015-01-22 DIAGNOSIS — Y998 Other external cause status: Secondary | ICD-10-CM | POA: Diagnosis not present

## 2015-01-22 DIAGNOSIS — R0602 Shortness of breath: Secondary | ICD-10-CM | POA: Diagnosis not present

## 2015-01-22 DIAGNOSIS — Y9389 Activity, other specified: Secondary | ICD-10-CM | POA: Diagnosis not present

## 2015-01-22 DIAGNOSIS — S29001A Unspecified injury of muscle and tendon of front wall of thorax, initial encounter: Secondary | ICD-10-CM | POA: Diagnosis present

## 2015-01-22 DIAGNOSIS — E119 Type 2 diabetes mellitus without complications: Secondary | ICD-10-CM | POA: Diagnosis not present

## 2015-01-22 DIAGNOSIS — Z8659 Personal history of other mental and behavioral disorders: Secondary | ICD-10-CM | POA: Insufficient documentation

## 2015-01-22 MED ORDER — HYDROCODONE-ACETAMINOPHEN 5-325 MG PO TABS: 1.0000 | ORAL_TABLET | Freq: Four times a day (QID) | ORAL | Status: DC | PRN

## 2015-01-22 NOTE — ED Notes (Signed)
Awake. Verbally responsive. A/O x4. Resp even and unlabored. No audible adventitious breath sounds noted. ABC's intact.  

## 2015-01-22 NOTE — Discharge Instructions (Signed)
Rib Fracture °A rib fracture is a break or crack in one of the bones of the ribs. The ribs are like a cage that goes around your upper chest. A broken or cracked rib is often painful, but most do not cause other problems. Most rib fractures heal on their own in 1-3 months. °HOME CARE °· Avoid activities that cause pain to the injured area. Protect your injured area. °· Slowly increase activity as told by your doctor. °· Take medicine as told by your doctor. °· Put ice on the injured area for the first 1-2 days after you have been treated or as told by your doctor. °¨ Put ice in a plastic bag. °¨ Place a towel between your skin and the bag. °¨ Leave the ice on for 15-20 minutes at a time, every 2 hours while you are awake. °· Do deep breathing as told by your doctor. You may be told to: °¨ Take deep breaths many times a day. °¨ Cough many times a day while hugging a pillow. °¨ Use a device (incentive spirometer) to perform deep breathing many times a day. °· Drink enough fluids to keep your pee (urine) clear or pale yellow.   °· Do not wear a rib belt or binder. These do not allow you to breathe deeply. °GET HELP RIGHT AWAY IF:  °· You have a fever. °· You have trouble breathing.   °· You cannot stop coughing. °· You cough up thick or bloody spit (mucus).   °· You feel sick to your stomach (nauseous), throw up (vomit), or have belly (abdominal) pain.   °· Your pain gets worse and medicine does not help.   °MAKE SURE YOU:  °· Understand these instructions. °· Will watch your condition. °· Will get help right away if you are not doing well or get worse. °  °This information is not intended to replace advice given to you by your health care provider. Make sure you discuss any questions you have with your health care provider. °  °Document Released: 10/04/2007 Document Revised: 04/21/2012 Document Reviewed: 02/27/2012 °Elsevier Interactive Patient Education ©2016 [employer] ° °

## 2015-01-22 NOTE — ED Notes (Signed)
Awake. Verbally responsive. A/O x4. Resp even and unlabored. No audible adventitious breath sounds noted. ABC's intact. Pt reported slipping and falling x3 days ago hitting head and causing pain to lt ribcage. Pt denies coughing/fever.

## 2015-01-22 NOTE — ED Provider Notes (Signed)
CSN: [id number]     Arrival date & time 01/22/15  0553 History   First MD Initiated Contact with Patient 01/22/15 [phone]     Chief Complaint  Patient presents with  . Fall     (Consider location/radiation/quality/duration/timing/severity/associated sxs/prior Treatment) Patient is a 65 y.o. male presenting with fall. The history is provided by the patient.  Fall Associated symptoms include chest pain and shortness of breath. Pertinent negatives include no abdominal pain and no headaches.   patient with a fall indoors he slipped on the steps and fell down at least 6 steps this occurred 3-4 days ago. No loss of consciousness no syncope. Patient did hit his head but his main complaint is left rib cage pain. Currently has no headache no abdominal pain no nausea no vomiting. No extremity pain. No neck pain no low back pain. Denies any open wounds. Denies fever. States does have some shortness of breath and difficulty breathing.  Past Medical History  Diagnosis Date  . Depression 01/26/2011  . Erectile dysfunction 01/26/2011  . 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 Anxiety and depression 07/19/2011  . DIABETES MELLITUS, TYPE II 12/24/2006    Qualifier: Diagnosis of  By: Jenny Reichmann MD, Hunt Oris   . HYPERLIPIDEMIA 12/24/2006    Qualifier: Diagnosis of  By: Jenny Reichmann MD, Hunt Oris   . HYPERTENSION 12/24/2006    Qualifier: Diagnosis of  By: Jenny Reichmann MD, Hunt Oris   . PSA, INCREASED 12/25/2006    Qualifier: Diagnosis of  By: Jenny Reichmann MD, Hunt Oris   . Diabetes ([place]) 03/09/2014   History reviewed. No pertinent past surgical history. Family History  Problem Relation Age of Onset  . Cancer Mother   . Cancer Father    Social History  Substance Use Topics  . Smoking status: Never Smoker   . Smokeless tobacco: Never Used  . Alcohol Use: No    Review of Systems  Constitutional: Negative for fever.  HENT: Negative for congestion.   Eyes: Negative  for visual disturbance.  Respiratory: Positive for shortness of breath. Negative for cough.   Cardiovascular: Positive for chest pain.  Gastrointestinal: Negative for nausea, vomiting and abdominal pain.  Genitourinary: Negative for dysuria.  Musculoskeletal: Negative for neck pain.  Skin: Negative for rash and wound.  Neurological: Negative for dizziness, syncope, weakness, numbness and headaches.  Hematological: Does not bruise/bleed easily.  Psychiatric/Behavioral: Negative for confusion.      Allergies  Review of patient's allergies indicates no known allergies.  Home Medications   Prior to Admission medications   Medication Sig Start Date End Date Taking? Authorizing Provider  aspirin 325 MG tablet Take 1 tablet (325 mg total) by mouth daily. 08/05/14   Geradine Girt, DO  atorvastatin (LIPITOR) 40 MG tablet Take 1 tablet (40 mg total) by mouth daily at 6 PM. 09/03/14   Biagio Borg, MD  BAYER MICROLET LANCETS lancets 1 each by Other route 2 (two) times daily. Use to help check blood sugars twice a day Dx E11.65 08/09/14   Biagio Borg, MD  Blood Glucose Monitoring Suppl (ONE TOUCH ULTRA 2) W/DEVICE KIT Use as directed 08/12/14   Biagio Borg, MD  glipiZIDE (GLUCOTROL) 10 MG tablet Take 1 tablet (10 mg total) by mouth 2 (two) times daily before a meal. 03/09/14   Biagio Borg, MD  glucose blood (ONE TOUCH ULTRA TEST) test strip 1 each  by Other route 2 (two) times daily. Use to check blood sugars twice a day  Dx code E11.65 10/06/14   Biagio Borg, MD  HYDROcodone-acetaminophen (NORCO/VICODIN) 5-325 MG tablet Take 1-2 tablets by mouth every 6 (six) hours as needed for moderate pain. 01/22/15   Fredia Sorrow, MD  metFORMIN (GLUCOPHAGE-XR) 500 MG 24 hr tablet Take 2 tablets (1,000 mg total) by mouth daily with breakfast. 03/09/14   Biagio Borg, MD  ONE [facility] LANCETS MISC Use to help check blood sugars twice a day Dx E11.9 09/28/14   Biagio Borg, MD  pioglitazone (ACTOS) 45 MG tablet Take 1  tablet (45 mg total) by mouth daily. 08/12/14   Biagio Borg, MD   BP 162/103 mmHg  Pulse 100  Temp(Src) 97.7 F (36.5 C) (Oral)  Resp 16  Ht 6' (1.829 m)  Wt 68.04 kg  BMI 20.34 kg/m2  SpO2 98% Physical Exam  Constitutional: He is oriented to person, place, and time. He appears well-developed and well-nourished. No distress.  HENT:  Head: Normocephalic and atraumatic.  Mouth/Throat: Oropharynx is clear and moist.  Eyes: Conjunctivae and EOM are normal. Pupils are equal, round, and reactive to light.  Neck: Normal range of motion. Neck supple.  No midline tenderness to the neck.  Cardiovascular: Normal rate, regular rhythm and intact distal pulses.   Pulmonary/Chest: Effort normal and breath sounds normal. No respiratory distress.  Decreased breath sounds on the left side. Tenderness to palpation to left lateral lower rib area.  Abdominal: Soft. Bowel sounds are normal. He exhibits no distension. There is no tenderness. There is no guarding.   no tenderness left upper quadrant.  Musculoskeletal: Normal range of motion. He exhibits no tenderness.  Neurological: He is alert and oriented to person, place, and time. No cranial nerve deficit. He exhibits normal muscle tone. Coordination normal.  Skin: Skin is warm. No rash noted.  Nursing note and vitals reviewed.   ED Course  Procedures (including critical care time) Labs Review Labs Reviewed - No data to display  Imaging Review Dg Chest 2 View  01/22/2015  CLINICAL DATA:  Pt c/o left rib and flank pain, pt states he fell down 6 indoor carpeted steps 3-4 days ago. Non smoker, HTN, diabetic. EXAM: CHEST  2 VIEW COMPARISON:  08/02/2014 FINDINGS: There are nondisplaced rib fractures on the left of the lateral seventh, eighth, ninth and tenth ribs. There is a minimal left pleural effusion with lung base atelectasis. No pneumothorax. Remainder of the lungs is clear. Heart, mediastinum and hila are unremarkable. IMPRESSION: 1. Nondisplaced  left rib fractures with associated left lung base atelectasis and a minimal pleural effusion. No pneumothorax. Electronically Signed   By: Lajean Manes M.D.   On: 01/22/2015 07:11   I have personally reviewed and evaluated these images and lab results as part of my medical decision-making.   EKG Interpretation None      MDM   Final diagnoses:  Rib fractures, left, closed, initial encounter    Patient with a fall down stairs 3-4 days ago as per patient. Since the fall patient's had complaint of left-sided low rib pain. No abdominal pain no loss of consciousness with fall stated he did hit his head but no complaints of neck pain or headache currently no nausea no vomiting. No fevers.  On exam abdomen nontender. Tender over the left lateral lower ribs. Chest x-ray confirms multiple rib fractures in that area with a small pleural effusion. No evidence of pneumothorax.  Will treat symptomatically with pain medicine and have patient follow-up with primary care doctor for reevaluation.  No concerns for any intra-abdominal injuries from the fall based on exam and symptoms.    Fredia Sorrow, MD 01/22/15 [phone]

## 2015-01-22 NOTE — ED Notes (Signed)
Pt c/o L rib and flank pain, pt states he fell down 6 indoor carpeted steps 3-4 days ago.

## 2015-03-03 ENCOUNTER — Other Ambulatory Visit: Payer: Self-pay | Admitting: *Deleted

## 2015-03-03 MED ORDER — GLIPIZIDE 10 MG PO TABS: 10.0000 mg | ORAL_TABLET | Freq: Two times a day (BID) | ORAL | Status: DC

## 2015-03-18 ENCOUNTER — Encounter: Payer: Medicare Other | Admitting: Internal Medicine

## 2015-04-28 ENCOUNTER — Other Ambulatory Visit: Payer: Self-pay | Admitting: Internal Medicine

## 2015-05-30 DIAGNOSIS — H6123 Impacted cerumen, bilateral: Secondary | ICD-10-CM | POA: Diagnosis not present

## 2015-05-30 DIAGNOSIS — E119 Type 2 diabetes mellitus without complications: Secondary | ICD-10-CM | POA: Diagnosis not present

## 2015-08-26 ENCOUNTER — Other Ambulatory Visit: Payer: Self-pay | Admitting: *Deleted

## 2015-08-26 MED ORDER — GLIPIZIDE 10 MG PO TABS
10.0000 mg | ORAL_TABLET | Freq: Two times a day (BID) | ORAL | 0 refills | Status: DC
Start: 2015-08-26 — End: 2016-04-02

## 2015-08-26 MED ORDER — ATORVASTATIN CALCIUM 40 MG PO TABS: 40.0000 mg | ORAL_TABLET | Freq: Every day | ORAL | 0 refills | Status: DC

## 2015-08-26 NOTE — Telephone Encounter (Signed)
Rec'd call pt requesting refills on his Lipitor & Glipizide. Inform pt he is due for yearly CPX w/labs can only send 30 day until he come in for appt. Pt states he will call back to set-up appt he need to check other appts dates that has been set-up...Raechel Chute/lmb

## 2015-09-21 DIAGNOSIS — Z23 Encounter for immunization: Secondary | ICD-10-CM | POA: Diagnosis not present

## 2015-10-14 ENCOUNTER — Telehealth: Payer: Self-pay | Admitting: Internal Medicine

## 2015-10-14 NOTE — Telephone Encounter (Signed)
Patient states he has ran out of metformin, actos and another medication he can not remember and states he is feeling a lot better as far as strength, vision, walking, overall health.  Patient has not taken meds for 48 hours.

## 2015-10-14 NOTE — Telephone Encounter (Signed)
This is surprising since his blood sugars have been very high in the past., unless there has been a change in diet, activity or weight loss  I would check  Blood sugars with glucometer for 3 days and call with results, and consider restart at least one or more of the medications if the sugars are > 200

## 2016-04-02 ENCOUNTER — Inpatient Hospital Stay (HOSPITAL_COMMUNITY)
Admission: EM | Admit: 2016-04-02 | Discharge: 2016-04-06 | DRG: 637 | Disposition: A | Discharge status: Skilled Nursing Facility | Payer: Medicare Other | Attending: Internal Medicine | Admitting: Internal Medicine

## 2016-04-02 ENCOUNTER — Observation Stay (HOSPITAL_COMMUNITY): Payer: Medicare Other

## 2016-04-02 ENCOUNTER — Encounter (HOSPITAL_COMMUNITY): Payer: Self-pay | Admitting: Emergency Medicine

## 2016-04-02 DIAGNOSIS — R634 Abnormal weight loss: Secondary | ICD-10-CM | POA: Diagnosis present

## 2016-04-02 DIAGNOSIS — E43 Unspecified severe protein-calorie malnutrition: Secondary | ICD-10-CM | POA: Insufficient documentation

## 2016-04-02 DIAGNOSIS — F0390 Unspecified dementia without behavioral disturbance: Secondary | ICD-10-CM | POA: Diagnosis not present

## 2016-04-02 DIAGNOSIS — Z809 Family history of malignant neoplasm, unspecified: Secondary | ICD-10-CM

## 2016-04-02 DIAGNOSIS — R269 Unspecified abnormalities of gait and mobility: Secondary | ICD-10-CM

## 2016-04-02 DIAGNOSIS — E860 Dehydration: Secondary | ICD-10-CM

## 2016-04-02 DIAGNOSIS — E1110 Type 2 diabetes mellitus with ketoacidosis without coma: Secondary | ICD-10-CM | POA: Diagnosis not present

## 2016-04-02 DIAGNOSIS — I10 Essential (primary) hypertension: Secondary | ICD-10-CM | POA: Diagnosis not present

## 2016-04-02 DIAGNOSIS — Z7982 Long term (current) use of aspirin: Secondary | ICD-10-CM

## 2016-04-02 DIAGNOSIS — R4781 Slurred speech: Secondary | ICD-10-CM | POA: Diagnosis not present

## 2016-04-02 DIAGNOSIS — G9340 Encephalopathy, unspecified: Secondary | ICD-10-CM

## 2016-04-02 DIAGNOSIS — R627 Adult failure to thrive: Secondary | ICD-10-CM | POA: Diagnosis present

## 2016-04-02 DIAGNOSIS — I248 Other forms of acute ischemic heart disease: Secondary | ICD-10-CM | POA: Diagnosis present

## 2016-04-02 DIAGNOSIS — J189 Pneumonia, unspecified organism: Secondary | ICD-10-CM | POA: Diagnosis not present

## 2016-04-02 DIAGNOSIS — Z9114 Patient's other noncompliance with medication regimen: Secondary | ICD-10-CM | POA: Diagnosis not present

## 2016-04-02 DIAGNOSIS — R197 Diarrhea, unspecified: Secondary | ICD-10-CM

## 2016-04-02 DIAGNOSIS — I69322 Dysarthria following cerebral infarction: Secondary | ICD-10-CM

## 2016-04-02 DIAGNOSIS — R2681 Unsteadiness on feet: Secondary | ICD-10-CM | POA: Diagnosis present

## 2016-04-02 DIAGNOSIS — R1310 Dysphagia, unspecified: Secondary | ICD-10-CM | POA: Diagnosis present

## 2016-04-02 DIAGNOSIS — G9341 Metabolic encephalopathy: Secondary | ICD-10-CM | POA: Diagnosis not present

## 2016-04-02 DIAGNOSIS — I6789 Other cerebrovascular disease: Secondary | ICD-10-CM | POA: Diagnosis not present

## 2016-04-02 DIAGNOSIS — Z91148 Patient's other noncompliance with medication regimen for other reason: Secondary | ICD-10-CM

## 2016-04-02 DIAGNOSIS — F4323 Adjustment disorder with mixed anxiety and depressed mood: Secondary | ICD-10-CM | POA: Diagnosis present

## 2016-04-02 DIAGNOSIS — N179 Acute kidney failure, unspecified: Secondary | ICD-10-CM | POA: Diagnosis present

## 2016-04-02 DIAGNOSIS — I69351 Hemiplegia and hemiparesis following cerebral infarction affecting right dominant side: Secondary | ICD-10-CM | POA: Diagnosis not present

## 2016-04-02 DIAGNOSIS — E0810 Diabetes mellitus due to underlying condition with ketoacidosis without coma: Secondary | ICD-10-CM

## 2016-04-02 DIAGNOSIS — IMO0001 Reserved for inherently not codable concepts without codable children: Secondary | ICD-10-CM | POA: Diagnosis present

## 2016-04-02 DIAGNOSIS — K529 Noninfective gastroenteritis and colitis, unspecified: Secondary | ICD-10-CM | POA: Diagnosis present

## 2016-04-02 DIAGNOSIS — R531 Weakness: Secondary | ICD-10-CM | POA: Diagnosis not present

## 2016-04-02 DIAGNOSIS — Z794 Long term (current) use of insulin: Secondary | ICD-10-CM

## 2016-04-02 DIAGNOSIS — Z9181 History of falling: Secondary | ICD-10-CM

## 2016-04-02 DIAGNOSIS — E1165 Type 2 diabetes mellitus with hyperglycemia: Secondary | ICD-10-CM

## 2016-04-02 DIAGNOSIS — Z9119 Patient's noncompliance with other medical treatment and regimen: Secondary | ICD-10-CM

## 2016-04-02 DIAGNOSIS — F411 Generalized anxiety disorder: Secondary | ICD-10-CM | POA: Diagnosis present

## 2016-04-02 DIAGNOSIS — Z79899 Other long term (current) drug therapy: Secondary | ICD-10-CM

## 2016-04-02 HISTORY — DX: Cerebral infarction, unspecified: I63.9

## 2016-04-02 HISTORY — DX: Type 2 diabetes mellitus with ketoacidosis without coma: E11.10

## 2016-04-02 LAB — CBC WITH DIFFERENTIAL/PLATELET
Basophils Absolute: 0 10*3/uL (ref 0.0–0.1)
Basophils Relative: 0 %
Eosinophils Absolute: 0 10*3/uL (ref 0.0–0.7)
Eosinophils Relative: 0 %
HCT: 39.7 % (ref 39.0–52.0)
Hemoglobin: 13.2 g/dL (ref 13.0–17.0)
Lymphocytes Relative: 4 %
Lymphs Abs: 0.5 10*3/uL — ABNORMAL LOW (ref 0.7–4.0)
MCH: 29.2 pg (ref 26.0–34.0)
MCHC: 33.2 g/dL (ref 30.0–36.0)
MCV: 87.8 fL (ref 78.0–100.0)
Monocytes Absolute: 0.5 10*3/uL (ref 0.1–1.0)
Monocytes Relative: 4 %
Neutro Abs: 12 10*3/uL — ABNORMAL HIGH (ref 1.7–7.7)
Neutrophils Relative %: 92 %
Platelets: 327 10*3/uL (ref 150–400)
RBC: 4.52 MIL/uL (ref 4.22–5.81)
RDW: 14.1 % (ref 11.5–15.5)
WBC: 13 10*3/uL — ABNORMAL HIGH (ref 4.0–10.5)

## 2016-04-02 LAB — URINALYSIS, ROUTINE W REFLEX MICROSCOPIC
Bacteria, UA: NONE SEEN
Bilirubin Urine: NEGATIVE
Glucose, UA: >=500 mg/dL — AB
Hgb urine dipstick: NEGATIVE
Ketones, ur: 80 mg/dL — AB
Leukocytes, UA: NEGATIVE
Nitrite: NEGATIVE
Protein, ur: NEGATIVE mg/dL
RBC / HPF: NONE SEEN RBC/hpf (ref 0–5)
Specific Gravity, Urine: 1.024 (ref 1.005–1.030)
pH: 5 (ref 5.0–8.0)

## 2016-04-02 LAB — BASIC METABOLIC PANEL
Anion gap: 13 (ref 5–15)
Anion gap: 11 (ref 5–15)
BUN: 34 mg/dL — AB (ref 6–20)
BUN: 27 mg/dL — AB (ref 6–20)
CO2: 25 mmol/L (ref 22–32)
CO2: 23 mmol/L (ref 22–32)
CREATININE: 0.96 mg/dL (ref 0.61–1.24)
CREATININE: 0.74 mg/dL (ref 0.61–1.24)
Calcium: 8.5 mg/dL — ABNORMAL LOW (ref 8.9–10.3)
Calcium: 8.2 mg/dL — ABNORMAL LOW (ref 8.9–10.3)
Chloride: 103 mmol/L (ref 101–111)
Chloride: 105 mmol/L (ref 101–111)
GFR calc Af Amer: >60 mL/min (ref >60)
GFR calc Af Amer: >60 mL/min (ref >60)
GLUCOSE: 129 mg/dL — AB (ref 65–99)
Glucose, Bld: 142 mg/dL — ABNORMAL HIGH (ref 65–99)
POTASSIUM: 3.9 mmol/L (ref 3.5–5.1)
Potassium: 3.6 mmol/L (ref 3.5–5.1)
SODIUM: 141 mmol/L (ref 135–145)
Sodium: 139 mmol/L (ref 135–145)

## 2016-04-02 LAB — CBG MONITORING, ED
GLUCOSE-CAPILLARY: 253 mg/dL — AB (ref 65–99)
Glucose-Capillary: 306 mg/dL — ABNORMAL HIGH (ref 65–99)

## 2016-04-02 LAB — COMPREHENSIVE METABOLIC PANEL
ALT: 17 U/L (ref 17–63)
AST: 15 U/L (ref 15–41)
Albumin: 2.7 g/dL — ABNORMAL LOW (ref 3.5–5.0)
Alkaline Phosphatase: 103 U/L (ref 38–126)
Anion gap: 22 — ABNORMAL HIGH (ref 5–15)
BUN: 43 mg/dL — ABNORMAL HIGH (ref 6–20)
CO2: 15 mmol/L — ABNORMAL LOW (ref 22–32)
Calcium: 9.1 mg/dL (ref 8.9–10.3)
Chloride: 101 mmol/L (ref 101–111)
Creatinine, Ser: 1.55 mg/dL — ABNORMAL HIGH (ref 0.61–1.24)
GFR calc Af Amer: 52 mL/min — ABNORMAL LOW (ref >60)
GFR calc non Af Amer: 45 mL/min — ABNORMAL LOW (ref >60)
Glucose, Bld: 391 mg/dL — ABNORMAL HIGH (ref 65–99)
Potassium: 4.4 mmol/L (ref 3.5–5.1)
Sodium: 138 mmol/L (ref 135–145)
Total Bilirubin: 2.1 mg/dL — ABNORMAL HIGH (ref 0.3–1.2)
Total Protein: 7.4 g/dL (ref 6.5–8.1)

## 2016-04-02 LAB — GLUCOSE, CAPILLARY
GLUCOSE-CAPILLARY: 226 mg/dL — AB (ref 65–99)
Glucose-Capillary: 181 mg/dL — ABNORMAL HIGH (ref 65–99)
Glucose-Capillary: 139 mg/dL — ABNORMAL HIGH (ref 65–99)
Glucose-Capillary: 61 mg/dL — ABNORMAL LOW (ref 65–99)
Glucose-Capillary: 91 mg/dL (ref 65–99)
Glucose-Capillary: 152 mg/dL — ABNORMAL HIGH (ref 65–99)

## 2016-04-02 LAB — MRSA PCR SCREENING: MRSA BY PCR: NEGATIVE

## 2016-04-02 LAB — TROPONIN I: Troponin I: 0.14 ng/mL (ref <0.03)

## 2016-04-02 LAB — VITAMIN B12: VITAMIN B 12: 689 pg/mL (ref 180–914)

## 2016-04-02 LAB — TSH: TSH: 0.931 u[IU]/mL (ref 0.350–4.500)

## 2016-04-02 MED ORDER — INSULIN GLARGINE 100 UNIT/ML ~~LOC~~ SOLN
10.0000 [IU] | Freq: Every day | SUBCUTANEOUS | Status: DC
  Administered 2016-04-03 – 2016-04-05 (×4): 10 [IU] via SUBCUTANEOUS
  Filled 2016-04-02 (×4): qty 0.1

## 2016-04-02 MED ORDER — ORAL CARE MOUTH RINSE
15.0000 mL | Freq: Two times a day (BID) | OROMUCOSAL | Status: DC
  Administered 2016-04-02 – 2016-04-06 (×8): 15 mL via OROMUCOSAL

## 2016-04-02 MED ORDER — MUPIROCIN 2 % EX OINT
TOPICAL_OINTMENT | Freq: Two times a day (BID) | CUTANEOUS | Status: AC
  Administered 2016-04-02: 22:00:00 via TOPICAL
  Administered 2016-04-03: 1 via TOPICAL
  Administered 2016-04-03: 10:00:00 via TOPICAL
  Administered 2016-04-04: 1 via TOPICAL
  Administered 2016-04-04 – 2016-04-05 (×2): via TOPICAL
  Filled 2016-04-02 (×2): qty 22

## 2016-04-02 MED ORDER — SODIUM CHLORIDE 0.9 % IV BOLUS (SEPSIS)
1000.0000 mL | Freq: Once | INTRAVENOUS | Status: AC
  Administered 2016-04-02: 1000 mL via INTRAVENOUS

## 2016-04-02 MED ORDER — SODIUM BICARBONATE 8.4 % IV SOLN
100.0000 meq | Freq: Once | INTRAVENOUS | Status: AC
  Administered 2016-04-02: 100 meq via INTRAVENOUS
  Filled 2016-04-02: qty 50

## 2016-04-02 MED ORDER — ASPIRIN 325 MG PO TABS
325.0000 mg | ORAL_TABLET | Freq: Every day | ORAL | Status: DC
  Administered 2016-04-02 – 2016-04-06 (×5): 325 mg via ORAL
  Filled 2016-04-02 (×5): qty 1

## 2016-04-02 MED ORDER — INSULIN REGULAR HUMAN 100 UNIT/ML IJ SOLN
INTRAMUSCULAR | Status: DC
  Administered 2016-04-02: 2.5 [IU]/h via INTRAVENOUS
  Filled 2016-04-02: qty 2.5

## 2016-04-02 MED ORDER — DEXTROSE-NACL 5-0.45 % IV SOLN
INTRAVENOUS | Status: DC
  Administered 2016-04-02: 18:00:00 via INTRAVENOUS

## 2016-04-02 MED ORDER — ATORVASTATIN CALCIUM 40 MG PO TABS
40.0000 mg | ORAL_TABLET | Freq: Every day | ORAL | Status: DC
  Administered 2016-04-02 – 2016-04-05 (×4): 40 mg via ORAL
  Filled 2016-04-02 (×5): qty 1

## 2016-04-02 MED ORDER — SODIUM CHLORIDE 0.9 % IV BOLUS (SEPSIS)
500.0000 mL | Freq: Once | INTRAVENOUS | Status: AC
  Administered 2016-04-02: 500 mL via INTRAVENOUS

## 2016-04-02 MED ORDER — POTASSIUM CHLORIDE CRYS ER 20 MEQ PO TBCR
40.0000 meq | EXTENDED_RELEASE_TABLET | Freq: Two times a day (BID) | ORAL | Status: AC
  Administered 2016-04-02 (×2): 40 meq via ORAL
  Filled 2016-04-02 (×2): qty 2

## 2016-04-02 MED ORDER — SODIUM CHLORIDE 0.9 % IV SOLN
INTRAVENOUS | Status: DC
  Administered 2016-04-02: 17:00:00 via INTRAVENOUS

## 2016-04-02 MED ORDER — CETAPHIL MOISTURIZING EX LOTN
TOPICAL_LOTION | Freq: Two times a day (BID) | CUTANEOUS | Status: DC
  Administered 2016-04-02: 22:00:00 via TOPICAL
  Administered 2016-04-03: 1 via TOPICAL
  Administered 2016-04-03 – 2016-04-05 (×4): via TOPICAL
  Administered 2016-04-05 – 2016-04-06 (×2): 1 via TOPICAL
  Filled 2016-04-02: qty 473

## 2016-04-02 MED ORDER — HEPARIN SODIUM (PORCINE) 5000 UNIT/ML IJ SOLN
5000.0000 [IU] | Freq: Three times a day (TID) | INTRAMUSCULAR | Status: DC
  Administered 2016-04-02 – 2016-04-06 (×12): 5000 [IU] via SUBCUTANEOUS
  Filled 2016-04-02 (×12): qty 1

## 2016-04-02 MED ORDER — SODIUM CHLORIDE 0.9 % IV SOLN: INTRAVENOUS | Status: DC

## 2016-04-02 MED ORDER — METOPROLOL TARTRATE 12.5 MG HALF TABLET
12.5000 mg | ORAL_TABLET | Freq: Two times a day (BID) | ORAL | Status: DC
  Administered 2016-04-02 – 2016-04-05 (×7): 12.5 mg via ORAL
  Filled 2016-04-02 (×7): qty 1

## 2016-04-02 NOTE — H&P (Signed)
History and Physical    Jake Chen EXB:[id number] DOB: Mar 24, 1950 DOA: 04/02/2016  PCP: Cathlean Cower, MD Patient coming from: home in [place]  Chief Complaint: persistent diarrhea/ dry mouth  HPI: Jake Chen is a 66 y.o. male with medical history significant for diabetes, hypertension, hyperlipidemia, depression, CVA since emergency department with chief complaint of persistent diarrhea and unintentional weight loss. Initial evaluation in the emergency department reveals acute encephalopathy versus dementia in the setting of diabetic ketoacidosis, tachycardia, acute kidney injury.  Information is obtained from the patient noting that information may be unreliable secondary to acute encephalopathy/dementia. He states he has had diarrhea for approximately 2 weeks. He states 2 days ago he was unable to tolerate food or liquid. He also reports unintentional weight loss. He does not manage his diabetes or monitor it in any form or fashion. He states he believes his speech is More slurred but he attributes that to having a "very dry mouth". He also reports increased fatigue. He denies headache dizziness syncope or near-syncope. He denies visual disturbances numbness tingling of extremities difficulty chewing or swallowing. He denies chest pain palpitations fever chills cough lower extremity edema. He denies abdominal pain nausea vomiting. Reports he moved to [place] several years ago and comes [place] to see his primary care provider.   ED Course: In the emergency department is afebrile hemodynamically stable tachycardia mild tachypnea, mild leukocytosis and glucose of 397 and an ion gap of 22. He is provided with 2 L of normal saline and insulin drip is initiated.  Review of Systems: As per HPI otherwise 10 point review of systems negative.   Ambulatory Status:ambulates independantly no falls  Past Medical History:  Diagnosis Date  . ADJ DISORDER WITH MIXED ANXIETY & DEPRESSED MOOD  02/23/2010   Qualifier: Diagnosis of  By: Jenny Reichmann MD, Hunt Oris   . Anxiety and depression 07/19/2011  . Depression 01/26/2011  . Diabetes ([place]) 03/09/2014  . DIABETES MELLITUS, TYPE II 12/24/2006   Qualifier: Diagnosis of  By: Jenny Reichmann MD, Hunt Oris   . Erectile dysfunction 01/26/2011  . HYPERLIPIDEMIA 12/24/2006   Qualifier: Diagnosis of  By: Jenny Reichmann MD, Hunt Oris   . HYPERTENSION 12/24/2006   Qualifier: Diagnosis of  By: Jenny Reichmann MD, Hunt Oris   . PSA, INCREASED 12/25/2006   Qualifier: Diagnosis of  By: Jenny Reichmann MD, Hunt Oris     History reviewed. No pertinent surgical history.  Social History   Social History  . Marital status: Divorced    Spouse name: N/A  . Number of children: N/A  . Years of education: N/A   Occupational History  . Not on file.   Social History Main Topics  . Smoking status: Never Smoker  . Smokeless tobacco: Never Used  . Alcohol use No  . Drug use: No  . Sexual activity: Yes   Other Topics Concern  . Not on file   Social History Narrative  . No narrative on file  lives in [place]. Retired Engineer, manufacturing systems  No Known Allergies  Family History  Problem Relation Age of Onset  . Cancer Mother   . Cancer Father     Prior to Admission medications   Medication Sig Start Date End Date Taking? Authorizing Provider  aspirin 325 MG tablet Take 1 tablet (325 mg total) by mouth daily. 08/05/14   Geradine Girt, DO  atorvastatin (LIPITOR) 40 MG tablet Take 1 tablet (40 mg total) by mouth daily at 6 PM. Must make yearly appt w/labs for  future refills 08/26/15   Biagio Borg, MD  BAYER MICROLET LANCETS lancets 1 each by Other route 2 (two) times daily. Use to help check blood sugars twice a day Dx E11.65 08/09/14   Biagio Borg, MD  Blood Glucose Monitoring Suppl (ONE TOUCH ULTRA 2) W/DEVICE KIT Use as directed 08/12/14   Biagio Borg, MD  glipiZIDE (GLUCOTROL) 10 MG tablet Take 1 tablet (10 mg total) by mouth 2 (two) times daily before a meal. Must make yearly appt w/labs for future  refills 08/26/15   Biagio Borg, MD  glucose blood (ONE TOUCH ULTRA TEST) test strip 1 each by Other route 2 (two) times daily. Use to check blood sugars twice a day  Dx code E11.65 10/06/14   Biagio Borg, MD  HYDROcodone-acetaminophen (NORCO/VICODIN) 5-325 MG tablet Take 1-2 tablets by mouth every 6 (six) hours as needed for moderate pain. 01/22/15   Fredia Sorrow, MD  metFORMIN (GLUCOPHAGE-XR) 500 MG 24 hr tablet TAKE TWO TABLETS BY MOUTH DAILY WITH BREAKFAST 04/28/15   Biagio Borg, MD  ONE TOUCH LANCETS MISC Use to help check blood sugars twice a day Dx E11.9 09/28/14   Biagio Borg, MD  pioglitazone (ACTOS) 45 MG tablet TAKE ONE TABLET BY MOUTH DAILY 04/28/15   Biagio Borg, MD    Physical Exam: Vitals:   04/02/16 1200 04/02/16 1215 04/02/16 1220 04/02/16 1230  BP: (!) 144/82 (!) 132/97  121/90  Pulse: (!) 109 (!) 115  (!) 112  Resp: 15 (!) 23  18  Temp:   98.3 F (36.8 C)   SpO2: 98% 96%  97%  Weight:         General:  Appears this frail but calm and comfortable Eyes:  PERRL, EOMI, normal lids, iris ENT:  grossly normal hearing, lips & tongue, membranes of his mouth are pink and very very dry Neck:  no LAD, masses or thyromegaly Cardiovascular:  Tachycardic but regular, no m/r/g. No LE edema.  Respiratory:  CTA bilaterally, no w/r/r. Normal respiratory effort. Abdomen:  soft, ntnd, positive bowel sounds but slightly sluggish no guarding or rebounding Skin:  Quite dry. Skin covering knuckles cracked, dry with dried blood. No other raxh Musculoskeletal:  grossly normal tone BUE/BLE, good ROM, no bony abnormality Psychiatric:  grossly normal mood and affect, speech fluent and appropriate, AOx3 Neurologic:  Alert oriented x 3. Speech slightly slurred bilateral grip 5/5 LE strength. Some coughing with drinking water cleared quickly. UE with some spastic movements while trying to grasp cup  Labs on Admission: I have personally reviewed following labs and imaging  studies  CBC:  Recent Labs Lab 04/02/16 1219  WBC 13.0*  NEUTROABS 12.0*  HGB 13.2  HCT 39.7  MCV 87.8  PLT 354   Basic Metabolic Panel:  Recent Labs Lab 04/02/16 1219  NA 138  K 4.4  CL 101  CO2 15*  GLUCOSE 391*  BUN 43*  CREATININE 1.55*  CALCIUM 9.1   GFR: CrCl cannot be calculated (Unknown ideal weight.). Liver Function Tests:  Recent Labs Lab 04/02/16 1219  AST 15  ALT 17  ALKPHOS 103  BILITOT 2.1*  PROT 7.4  ALBUMIN 2.7*   No results for input(s): LIPASE, AMYLASE in the last 168 hours. No results for input(s): AMMONIA in the last 168 hours. Coagulation Profile: No results for input(s): INR, PROTIME in the last 168 hours. Cardiac Enzymes: No results for input(s): CKTOTAL, CKMB, CKMBINDEX, TROPONINI in the last 168 hours. BNP (  last 3 results) No results for input(s): PROBNP in the last 8760 hours. HbA1C: No results for input(s): HGBA1C in the last 72 hours. CBG: No results for input(s): GLUCAP in the last 168 hours. Lipid Profile: No results for input(s): CHOL, HDL, LDLCALC, TRIG, CHOLHDL, LDLDIRECT in the last 72 hours. Thyroid Function Tests: No results for input(s): TSH, T4TOTAL, FREET4, T3FREE, THYROIDAB in the last 72 hours. Anemia Panel: No results for input(s): VITAMINB12, FOLATE, FERRITIN, TIBC, IRON, RETICCTPCT in the last 72 hours. Urine analysis:    Component Value Date/Time   COLORURINE YELLOW 04/02/2016 1301   APPEARANCEUR HAZY (A) 04/02/2016 1301   LABSPEC 1.024 04/02/2016 1301   PHURINE 5.0 04/02/2016 1301   GLUCOSEU >=500 (A) 04/02/2016 1301   GLUCOSEU >=1000 (A) 03/09/2014 1657   HGBUR NEGATIVE 04/02/2016 1301   BILIRUBINUR NEGATIVE 04/02/2016 1301   KETONESUR 80 (A) 04/02/2016 1301   PROTEINUR NEGATIVE 04/02/2016 1301   UROBILINOGEN 0.2 03/09/2014 1657   NITRITE NEGATIVE 04/02/2016 1301   LEUKOCYTESUR NEGATIVE 04/02/2016 1301    Creatinine Clearance: CrCl cannot be calculated (Unknown ideal weight.).  Sepsis  Labs: _0 (procalcitonin:4,lacticidven:4) )No results found for this or any previous visit (from the past 240 hour(s)).   Radiological Exams on Admission: No results found.  EKG: Independently reviewed. Sinus tachycardia with irregular rate Borderline right axis deviation  Assessment/Plan Principal Problem:   DKA (diabetic ketoacidoses) (HCC) Active Problems:   Anxiety state   Essential hypertension   Uncontrolled diabetes mellitus without complication ([place])   Acute kidney injury ([place])   Encephalopathy   Diarrhea   Unintentional weight loss   #1. DKA. Likely related to noncompliance in the setting of dementia. Home medications include Lantus and oral agents. Serum glucose 391 on admission. An ion gap 22. He is provided with 2 L of normal saline in the emergency department and insulin drip was initiated. -Admit to step down -Continue insulin drip per protocol -give another 500cc bolus NS -Continue IV fluids per protocol -bmet every 4 hours until gap closed -transition fluids to D5 per protocol -hold oral agents -hold Lantus for now -clear liquid diet -advance diet as indicated -diabetic coordinator  #2. Acute kidney injury. Creatinine 1.55. Chart review indicates creatinine within the limits of normal last year. Likely related to above. -IV fluids as noted above -Hold nephrotoxins -Monitor urine output -Recheck in the morning  #3. Acute encephalopathy related to metabolic derangement vs dementia. No unifocal defecits.  Doubt infectious process. Urine not consistent with UTI. Mild leukocytosis. Afebrile. MOE. EKG without acute changes. -rehydrate -address metabolic derangement -if no improvement consider MRI -obtain chest xray -cycle troponin -obtain TSH, folate RPR  #4. Diarrhea. Patient reports persistent diarrhea over 2 weeks. No episodes since he's been here. He states that eating stimulates. Mild leukocytosis. He is afebrile and nontoxic appearing. Denies any  recent antibiotics. Denies abdominal pain -clear liquid -gi pathogen panel  #5. Hypertension. Only fair control in the emergency department. Home medications include no anti-hypertensive med -will start low dose BB -monitor  6. Diabetes. See #1. Home regimen includes oral agents and Lantus. Chart review indicates patient has refused any injectable meds in the past. -obtain A1c.  7. History of stroke. Had some residual dysarthria and right sided weakness -continue asa and statin -physical therapy -speech therapy     DVT prophylaxis: heparin Code Status: full  Family Communication:  Left message for daughter in law and son Disposition Plan: to be determined Consults called: diabetes  Admission status: obs  Radene Gunning MD [facility]  If 7PM-7AM, please contact night-coverage [email or web address] Password [facility]  04/02/2016, 3:07 PM
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
## 2016-04-02 NOTE — Plan of Care (Signed)
Problem: Safety: Goal: Ability to remain free from injury will improve Outcome: Progressing Discussed plan of care for the evening with glucostabilizer with some teach back displayed

## 2016-04-02 NOTE — ED Triage Notes (Signed)
[facility] with weakness/fatigue and slurred speech x approx 1 wk. Pt has hx of CVA with no residual, EMS states slurred speech sounds more pronounced now. Pt also c/o diarrhea x 2 weeks. EMS gave 27100ml's NS, CBG 374. Pt lives by self, MAE's equally
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
## 2016-04-02 NOTE — ED Provider Notes (Signed)
[facility] DEPT Provider Note   CSN: [id number] Arrival date & time: 04/02/16  1058  By signing my name below, I, Sonum Patel, attest that this documentation has been prepared under the direction and in the presence of Virgel Manifold, MD. Electronically Signed: Ludger Nutting, Scribe. 04/02/16. 12:20 PM.  History   Chief Complaint Chief Complaint  Patient presents with  . Weakness  . Aphasia    The history is provided by the patient. No language interpreter was used.     HPI Comments: Jake Chen is a 66 y.o. male brought in by ambulance, who presents to the Emergency Department complaining of intermittent diarrhea for the past 2 weeks. He reports associated weight loss which he as well as others around him have noticed. He states he has been drinking fluids well and eating well up until 2-3 days ago when he stopped eating solid foods. He denies any body pain, nausea, blood in stool. He denies sick contacts with similar symptoms. He has a history of a CVA but without any residual deficits. He states his speech is more slurred than usual which he believes is related to a dry mouth. He denies new weakness, numbness, or paresthesia.   He also complains of wounds to his bilateral hands and feet that he attributes to dry air. He states he has attempted to use a humidifier but is unable to change the water as he lives alone.   Past Medical History:  Diagnosis Date  . ADJ DISORDER WITH MIXED ANXIETY & DEPRESSED MOOD 02/23/2010   Qualifier: Diagnosis of  By: Jenny Reichmann MD, Hunt Oris   . Anxiety and depression 07/19/2011  . Depression 01/26/2011  . Diabetes ([place]) 03/09/2014  . DIABETES MELLITUS, TYPE II 12/24/2006   Qualifier: Diagnosis of  By: Jenny Reichmann MD, Hunt Oris   . Erectile dysfunction 01/26/2011  . HYPERLIPIDEMIA 12/24/2006   Qualifier: Diagnosis of  By: Jenny Reichmann MD, Hunt Oris   . HYPERTENSION 12/24/2006   Qualifier: Diagnosis of  By: Jenny Reichmann MD, Hunt Oris   . PSA, INCREASED 12/25/2006   Qualifier:  Diagnosis of  By: Jenny Reichmann MD, Hunt Oris     Patient Active Problem List   Diagnosis Date Noted  . Dysarthria   . CVA (cerebral infarction) 08/02/2014  . Uncontrolled diabetes mellitus without complication ([place]) 52/77/8242  . Anxiety and depression 07/19/2011  . Preventative health care 01/26/2011  . Depression 01/26/2011  . Erectile dysfunction 01/26/2011  . 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 WEIGHT LOSS 06/24/2007  . PSA, INCREASED 12/25/2006  . Hyperlipidemia 12/24/2006  . HYPERLIPIDEMIA 12/24/2006  . ANXIETY 12/24/2006  . DEPRESSION 12/24/2006  . Essential hypertension 12/24/2006    History reviewed. No pertinent surgical history.     Home Medications    Prior to Admission medications   Medication Sig Start Date End Date Taking? Authorizing Provider  aspirin 325 MG tablet Take 1 tablet (325 mg total) by mouth daily. 08/05/14   Geradine Girt, DO  atorvastatin (LIPITOR) 40 MG tablet Take 1 tablet (40 mg total) by mouth daily at 6 PM. Must make yearly appt w/labs for future refills 08/26/15   Biagio Borg, MD  BAYER MICROLET LANCETS lancets 1 each by Other route 2 (two) times daily. Use to help check blood sugars twice a day Dx E11.65 08/09/14   Biagio Borg, MD  Blood Glucose Monitoring Suppl (ONE TOUCH ULTRA 2) W/DEVICE KIT Use as directed 08/12/14   Biagio Borg, MD  glipiZIDE (GLUCOTROL) 10 MG tablet Take 1 tablet (10 mg total) by mouth 2 (two) times daily before a meal. Must make yearly appt w/labs for future refills 08/26/15   Biagio Borg, MD  glucose blood (ONE TOUCH ULTRA TEST) test strip 1 each by Other route 2 (two) times daily. Use to check blood sugars twice a day  Dx code E11.65 10/06/14   Biagio Borg, MD  HYDROcodone-acetaminophen (NORCO/VICODIN) 5-325 MG tablet Take 1-2 tablets by mouth every 6 (six) hours as needed for moderate pain. 01/22/15   Fredia Sorrow, MD  metFORMIN (GLUCOPHAGE-XR) 500 MG 24 hr tablet TAKE TWO TABLETS BY MOUTH DAILY  WITH BREAKFAST 04/28/15   Biagio Borg, MD  ONE [facility] LANCETS MISC Use to help check blood sugars twice a day Dx E11.9 09/28/14   Biagio Borg, MD  pioglitazone (ACTOS) 45 MG tablet TAKE ONE TABLET BY MOUTH DAILY 04/28/15   Biagio Borg, MD    Family History Family History  Problem Relation Age of Onset  . Cancer Mother   . Cancer Father     Social History Social History  Substance Use Topics  . Smoking status: Never Smoker  . Smokeless tobacco: Never Used  . Alcohol use No     Allergies   Patient has no known allergies.   Review of Systems Review of Systems  A complete 10 system review of systems was obtained and all systems are negative except as noted in the HPI and PMH.    Physical Exam Updated Vital Signs BP (!) 143/78   Resp 17   Wt 150 lb (68 kg)   SpO2 96%   BMI 20.34 kg/m   Physical Exam  Constitutional: He is oriented to person, place, and time. He appears well-developed and well-nourished.  HENT:  Head: Normocephalic and atraumatic.  Mouth/Throat: Mucous membranes are dry.  Eyes: EOM are normal.  Neck: Normal range of motion.  Cardiovascular: Regular rhythm, normal heart sounds and intact distal pulses.  Tachycardia present.   Pulmonary/Chest: Effort normal and breath sounds normal. No respiratory distress.  Abdominal: Soft. He exhibits no distension. There is no tenderness.  Musculoskeletal: Normal range of motion.  Neurological: He is alert and oriented to person, place, and time. He has normal strength. He displays normal reflexes. No cranial nerve deficit or sensory deficit. He exhibits normal muscle tone. Coordination normal.  Normal finger to nose testing. Slurred speech  Skin: Skin is warm and dry.  Callused skin and fissures to extensor creases to multiple fingers.  Psychiatric: He has a normal mood and affect. Judgment normal.  Nursing note and vitals reviewed.   Normal neuro, slurred speech   ED Treatments / Results  DIAGNOSTIC  STUDIES: Oxygen Saturation is 96% on RA, adequate by my interpretation.    COORDINATION OF CARE: 11:52 AM Discussed treatment plan with pt at bedside and pt agreed to plan.    Labs (all labs ordered are listed, but only abnormal results are displayed) Labs Reviewed  COMPREHENSIVE METABOLIC PANEL - Abnormal; Notable for the following:       Result Value   CO2 15 (*)    Glucose, Bld 391 (*)    BUN 43 (*)    Creatinine, Ser 1.55 (*)    Albumin 2.7 (*)    Total Bilirubin 2.1 (*)    GFR calc non Af Amer 45 (*)    GFR calc Af Amer 52 (*)    Anion gap 22 (*)  All other components within normal limits  CBC WITH DIFFERENTIAL/PLATELET - Abnormal; Notable for the following:    WBC 13.0 (*)    Neutro Abs 12.0 (*)    Lymphs Abs 0.5 (*)    All other components within normal limits  URINALYSIS, ROUTINE W REFLEX MICROSCOPIC - Abnormal; Notable for the following:    APPearance HAZY (*)    Glucose, UA >=500 (*)    Ketones, ur 80 (*)    Squamous Epithelial / LPF 0-5 (*)    All other components within normal limits  BASIC METABOLIC PANEL - Abnormal; Notable for the following:    Glucose, Bld 142 (*)    BUN 34 (*)    Calcium 8.5 (*)    All other components within normal limits  GLUCOSE, CAPILLARY - Abnormal; Notable for the following:    Glucose-Capillary 226 (*)    All other components within normal limits  FOLATE RBC - Abnormal; Notable for the following:    Hematocrit 35.8 (*)    All other components within normal limits  GLUCOSE, CAPILLARY - Abnormal; Notable for the following:    Glucose-Capillary 181 (*)    All other components within normal limits  TROPONIN I - Abnormal; Notable for the following:    Troponin I 0.14 (*)    All other components within normal limits  TROPONIN I - Abnormal; Notable for the following:    Troponin I 0.09 (*)    All other components within normal limits  BASIC METABOLIC PANEL - Abnormal; Notable for the following:    Glucose, Bld 129 (*)    BUN  27 (*)    Calcium 8.2 (*)    All other components within normal limits  GLUCOSE, CAPILLARY - Abnormal; Notable for the following:    Glucose-Capillary 139 (*)    All other components within normal limits  TROPONIN I - Abnormal; Notable for the following:    Troponin I 0.07 (*)    All other components within normal limits  BASIC METABOLIC PANEL - Abnormal; Notable for the following:    Glucose, Bld 162 (*)    BUN 26 (*)    Calcium 8.2 (*)    All other components within normal limits  BASIC METABOLIC PANEL - Abnormal; Notable for the following:    Glucose, Bld 107 (*)    Creatinine, Ser 0.57 (*)    Calcium 8.2 (*)    All other components within normal limits  GLUCOSE, CAPILLARY - Abnormal; Notable for the following:    Glucose-Capillary 61 (*)    All other components within normal limits  GLUCOSE, CAPILLARY - Abnormal; Notable for the following:    Glucose-Capillary 152 (*)    All other components within normal limits  GLUCOSE, CAPILLARY - Abnormal; Notable for the following:    Glucose-Capillary 159 (*)    All other components within normal limits  GLUCOSE, CAPILLARY - Abnormal; Notable for the following:    Glucose-Capillary 171 (*)    All other components within normal limits  TROPONIN I - Abnormal; Notable for the following:    Troponin I 0.05 (*)    All other components within normal limits  GLUCOSE, CAPILLARY - Abnormal; Notable for the following:    Glucose-Capillary 102 (*)    All other components within normal limits  CBC - Abnormal; Notable for the following:    WBC 12.2 (*)    RBC 4.12 (*)    Hemoglobin 11.8 (*)    HCT 35.8 (*)  All other components within normal limits  GLUCOSE, CAPILLARY - Abnormal; Notable for the following:    Glucose-Capillary 241 (*)    All other components within normal limits  CBC - Abnormal; Notable for the following:    RBC 3.91 (*)    Hemoglobin 10.9 (*)    HCT 33.7 (*)    All other components within normal limits  BASIC  METABOLIC PANEL - Abnormal; Notable for the following:    Creatinine, Ser 0.51 (*)    Calcium 8.3 (*)    All other components within normal limits  GLUCOSE, CAPILLARY - Abnormal; Notable for the following:    Glucose-Capillary 168 (*)    All other components within normal limits  GLUCOSE, CAPILLARY - Abnormal; Notable for the following:    Glucose-Capillary 207 (*)    All other components within normal limits  GLUCOSE, CAPILLARY - Abnormal; Notable for the following:    Glucose-Capillary 206 (*)    All other components within normal limits  CBG MONITORING, ED - Abnormal; Notable for the following:    Glucose-Capillary 306 (*)    All other components within normal limits  CBG MONITORING, ED - Abnormal; Notable for the following:    Glucose-Capillary 253 (*)    All other components within normal limits  MRSA PCR SCREENING  C DIFFICILE QUICK SCREEN W PCR REFLEX  GASTROINTESTINAL PANEL BY PCR, STOOL (REPLACES STOOL CULTURE)  VITAMIN B12  RPR  TSH  GLUCOSE, CAPILLARY  TROPONIN I  PROCALCITONIN  GLUCOSE, CAPILLARY  HEMOGLOBIN A1C    EKG  EKG Interpretation  Date/Time:  Monday April 02 2016 11:07:27 EDT Ventricular Rate:  115 PR Interval:    QRS Duration: 74 QT Interval:  321 QTC Calculation: 444 R Axis:   81 Text Interpretation:  Sinus tachycardia with irregular rate Borderline right axis deviation no prior EKG Baseline wander Confirmed by LIU MD, DANA ([id number]) on 04/03/2016 12:51:13 PM       Radiology No results found.  Procedures Procedures (including critical care time)  CRITICAL CARE Performed by: Virgel Manifold Total critical care time: 35 minutes Critical care time was exclusive of separately billable procedures and treating other patients. Critical care was necessary to treat or prevent imminent or life-threatening deterioration. Critical care was time spent personally by me on the following activities: development of treatment plan with patient and/or  surrogate as well as nursing, discussions with consultants, evaluation of patient's response to treatment, examination of patient, obtaining history from patient or surrogate, ordering and performing treatments and interventions, ordering and review of laboratory studies, ordering and review of radiographic studies, pulse oximetry and re-evaluation of patient's condition.  Medications Ordered in ED Medications - No data to display   Initial Impression / Assessment and Plan / ED Course  I have reviewed the triage vital signs and the nursing notes.  Pertinent labs & imaging results that were available during my care of the patient were reviewed by me and considered in my medical decision making (see chart for details).     66 year old male with nausea and vomiting progressive weight loss. He took it upon himself to take himself off all of his medications. There is a telephone note in his chart mentioning this.   10/14/2015:  "Patient states he has ran out of metformin, actos and another medication he can not remember and states he is feeling a lot better as far as strength, vision, walking, overall health.  Patient has not taken meds for 48 hours."  "This is  surprising since his blood sugars have been very high in the past., unless there has been a change in diet, activity or weight loss. I would check  Blood sugars with glucometer for 3 days and call with results, and consider restart at least one or more of the medications if the sugars are > 200." - Dr. Cathlean Cower.  Workup today is consistent with diabetic ketoacidosis. IVF. Insulin. Admit.   Final Clinical Impressions(s) / ED Diagnoses   Final diagnoses:  Encephalopathy  Diabetic ketoacidosis without coma associated with type 2 diabetes mellitus ([place])  Noncompliance with medication regimen  Dehydration    New Prescriptions New Prescriptions   No medications on file   I personally preformed the services scribed in my presence. The  recorded information has been reviewed is accurate. Virgel Manifold, MD.    Virgel Manifold, MD 04/04/16 1420

## 2016-04-02 NOTE — ED Notes (Signed)
CBG 306 

## 2016-04-02 NOTE — ED Notes (Signed)
Report attempted 

## 2016-04-02 NOTE — Progress Notes (Signed)
Pt arrived to [id number] from ED. MRSA swab and CHG bath done. Pt has multiple cracks on bilateral hands and feet. States it is from dry skin and humidity. Right hand has 5 cuts, left hand has 6 cuts. Left foot has a cut on posterior heel and right foot has a sore on the great toe.

## 2016-04-03 ENCOUNTER — Observation Stay (HOSPITAL_COMMUNITY): Payer: Medicare Other

## 2016-04-03 ENCOUNTER — Telehealth: Payer: Self-pay | Admitting: Emergency Medicine

## 2016-04-03 ENCOUNTER — Telehealth: Payer: Self-pay | Admitting: Internal Medicine

## 2016-04-03 DIAGNOSIS — E785 Hyperlipidemia, unspecified: Secondary | ICD-10-CM | POA: Diagnosis not present

## 2016-04-03 DIAGNOSIS — J181 Lobar pneumonia, unspecified organism: Secondary | ICD-10-CM | POA: Diagnosis not present

## 2016-04-03 DIAGNOSIS — A09 Infectious gastroenteritis and colitis, unspecified: Secondary | ICD-10-CM

## 2016-04-03 DIAGNOSIS — R634 Abnormal weight loss: Secondary | ICD-10-CM | POA: Diagnosis not present

## 2016-04-03 DIAGNOSIS — E1310 Other specified diabetes mellitus with ketoacidosis without coma: Secondary | ICD-10-CM | POA: Diagnosis not present

## 2016-04-03 DIAGNOSIS — R410 Disorientation, unspecified: Secondary | ICD-10-CM | POA: Diagnosis not present

## 2016-04-03 DIAGNOSIS — E1165 Type 2 diabetes mellitus with hyperglycemia: Secondary | ICD-10-CM | POA: Diagnosis not present

## 2016-04-03 DIAGNOSIS — F411 Generalized anxiety disorder: Secondary | ICD-10-CM | POA: Diagnosis not present

## 2016-04-03 DIAGNOSIS — Z79899 Other long term (current) drug therapy: Secondary | ICD-10-CM

## 2016-04-03 DIAGNOSIS — E0810 Diabetes mellitus due to underlying condition with ketoacidosis without coma: Secondary | ICD-10-CM | POA: Diagnosis not present

## 2016-04-03 DIAGNOSIS — R197 Diarrhea, unspecified: Secondary | ICD-10-CM | POA: Diagnosis not present

## 2016-04-03 DIAGNOSIS — G9340 Encephalopathy, unspecified: Secondary | ICD-10-CM | POA: Diagnosis not present

## 2016-04-03 DIAGNOSIS — Z7982 Long term (current) use of aspirin: Secondary | ICD-10-CM

## 2016-04-03 DIAGNOSIS — R935 Abnormal findings on diagnostic imaging of other abdominal regions, including retroperitoneum: Secondary | ICD-10-CM | POA: Diagnosis not present

## 2016-04-03 DIAGNOSIS — R079 Chest pain, unspecified: Secondary | ICD-10-CM | POA: Diagnosis not present

## 2016-04-03 DIAGNOSIS — I248 Other forms of acute ischemic heart disease: Secondary | ICD-10-CM | POA: Diagnosis present

## 2016-04-03 DIAGNOSIS — I69322 Dysarthria following cerebral infarction: Secondary | ICD-10-CM | POA: Diagnosis not present

## 2016-04-03 DIAGNOSIS — N179 Acute kidney failure, unspecified: Secondary | ICD-10-CM | POA: Diagnosis not present

## 2016-04-03 DIAGNOSIS — I69398 Other sequelae of cerebral infarction: Secondary | ICD-10-CM | POA: Diagnosis not present

## 2016-04-03 DIAGNOSIS — R2681 Unsteadiness on feet: Secondary | ICD-10-CM | POA: Diagnosis present

## 2016-04-03 DIAGNOSIS — R278 Other lack of coordination: Secondary | ICD-10-CM | POA: Diagnosis not present

## 2016-04-03 DIAGNOSIS — G9341 Metabolic encephalopathy: Secondary | ICD-10-CM | POA: Diagnosis not present

## 2016-04-03 DIAGNOSIS — Z809 Family history of malignant neoplasm, unspecified: Secondary | ICD-10-CM | POA: Diagnosis not present

## 2016-04-03 DIAGNOSIS — J189 Pneumonia, unspecified organism: Secondary | ICD-10-CM | POA: Diagnosis present

## 2016-04-03 DIAGNOSIS — I10 Essential (primary) hypertension: Secondary | ICD-10-CM | POA: Diagnosis present

## 2016-04-03 DIAGNOSIS — Z794 Long term (current) use of insulin: Secondary | ICD-10-CM | POA: Diagnosis not present

## 2016-04-03 DIAGNOSIS — F4323 Adjustment disorder with mixed anxiety and depressed mood: Secondary | ICD-10-CM | POA: Diagnosis not present

## 2016-04-03 DIAGNOSIS — F418 Other specified anxiety disorders: Secondary | ICD-10-CM | POA: Diagnosis not present

## 2016-04-03 DIAGNOSIS — Z9181 History of falling: Secondary | ICD-10-CM | POA: Diagnosis not present

## 2016-04-03 DIAGNOSIS — R1310 Dysphagia, unspecified: Secondary | ICD-10-CM | POA: Diagnosis present

## 2016-04-03 DIAGNOSIS — E860 Dehydration: Secondary | ICD-10-CM | POA: Diagnosis not present

## 2016-04-03 DIAGNOSIS — I69351 Hemiplegia and hemiparesis following cerebral infarction affecting right dominant side: Secondary | ICD-10-CM | POA: Diagnosis not present

## 2016-04-03 DIAGNOSIS — F0390 Unspecified dementia without behavioral disturbance: Secondary | ICD-10-CM | POA: Diagnosis present

## 2016-04-03 DIAGNOSIS — K529 Noninfective gastroenteritis and colitis, unspecified: Secondary | ICD-10-CM | POA: Diagnosis present

## 2016-04-03 DIAGNOSIS — M6281 Muscle weakness (generalized): Secondary | ICD-10-CM | POA: Diagnosis not present

## 2016-04-03 DIAGNOSIS — E1110 Type 2 diabetes mellitus with ketoacidosis without coma: Secondary | ICD-10-CM | POA: Diagnosis present

## 2016-04-03 DIAGNOSIS — E43 Unspecified severe protein-calorie malnutrition: Secondary | ICD-10-CM | POA: Diagnosis not present

## 2016-04-03 DIAGNOSIS — Z9119 Patient's noncompliance with other medical treatment and regimen: Secondary | ICD-10-CM | POA: Diagnosis not present

## 2016-04-03 DIAGNOSIS — R627 Adult failure to thrive: Secondary | ICD-10-CM | POA: Diagnosis present

## 2016-04-03 LAB — BASIC METABOLIC PANEL
Anion gap: 9 (ref 5–15)
Anion gap: 9 (ref 5–15)
BUN: 26 mg/dL — ABNORMAL HIGH (ref 6–20)
BUN: 20 mg/dL (ref 6–20)
CALCIUM: 8.2 mg/dL — AB (ref 8.9–10.3)
CHLORIDE: 104 mmol/L (ref 101–111)
CHLORIDE: 106 mmol/L (ref 101–111)
CO2: 23 mmol/L (ref 22–32)
CO2: 24 mmol/L (ref 22–32)
CREATININE: 0.71 mg/dL (ref 0.61–1.24)
CREATININE: 0.57 mg/dL — AB (ref 0.61–1.24)
Calcium: 8.2 mg/dL — ABNORMAL LOW (ref 8.9–10.3)
GFR calc non Af Amer: >60 mL/min (ref >60)
Glucose, Bld: 162 mg/dL — ABNORMAL HIGH (ref 65–99)
Glucose, Bld: 107 mg/dL — ABNORMAL HIGH (ref 65–99)
Potassium: 3.8 mmol/L (ref 3.5–5.1)
Potassium: 3.7 mmol/L (ref 3.5–5.1)
SODIUM: 136 mmol/L (ref 135–145)
SODIUM: 139 mmol/L (ref 135–145)

## 2016-04-03 LAB — FOLATE RBC
Folate, Hemolysate: 438.6 ng/mL
Folate, RBC: 1225 ng/mL (ref >498)
Hematocrit: 35.8 % — ABNORMAL LOW (ref 37.5–51.0)

## 2016-04-03 LAB — GLUCOSE, CAPILLARY
GLUCOSE-CAPILLARY: 159 mg/dL — AB (ref 65–99)
GLUCOSE-CAPILLARY: 171 mg/dL — AB (ref 65–99)
GLUCOSE-CAPILLARY: 207 mg/dL — AB (ref 65–99)
Glucose-Capillary: 102 mg/dL — ABNORMAL HIGH (ref 65–99)
Glucose-Capillary: 241 mg/dL — ABNORMAL HIGH (ref 65–99)
Glucose-Capillary: 168 mg/dL — ABNORMAL HIGH (ref 65–99)

## 2016-04-03 LAB — TROPONIN I
TROPONIN I: 0.07 ng/mL — AB (ref <0.03)
Troponin I: 0.09 ng/mL (ref <0.03)
Troponin I: 0.05 ng/mL (ref <0.03)
Troponin I: <0.03 ng/mL (ref <0.03)

## 2016-04-03 LAB — C DIFFICILE QUICK SCREEN W PCR REFLEX
C DIFFICLE (CDIFF) ANTIGEN: NEGATIVE
C Diff interpretation: NOT DETECTED
C Diff toxin: NEGATIVE

## 2016-04-03 LAB — CBC
HCT: 35.8 % — ABNORMAL LOW (ref 39.0–52.0)
HEMOGLOBIN: 11.8 g/dL — AB (ref 13.0–17.0)
MCH: 28.6 pg (ref 26.0–34.0)
MCHC: 33 g/dL (ref 30.0–36.0)
MCV: 86.9 fL (ref 78.0–100.0)
PLATELETS: 269 10*3/uL (ref 150–400)
RBC: 4.12 MIL/uL — AB (ref 4.22–5.81)
RDW: 14 % (ref 11.5–15.5)
WBC: 12.2 10*3/uL — ABNORMAL HIGH (ref 4.0–10.5)

## 2016-04-03 LAB — PROCALCITONIN: Procalcitonin: 2.97 ng/mL

## 2016-04-03 LAB — RPR: RPR: NONREACTIVE

## 2016-04-03 MED ORDER — INSULIN ASPART 100 UNIT/ML ~~LOC~~ SOLN
0.0000 [IU] | Freq: Three times a day (TID) | SUBCUTANEOUS | Status: DC
  Administered 2016-04-03: 2 [IU] via SUBCUTANEOUS
  Administered 2016-04-03 – 2016-04-04 (×3): 3 [IU] via SUBCUTANEOUS
  Administered 2016-04-05: 9 [IU] via SUBCUTANEOUS
  Administered 2016-04-05: 5 [IU] via SUBCUTANEOUS
  Administered 2016-04-05: 3 [IU] via SUBCUTANEOUS
  Administered 2016-04-06: 9 [IU] via SUBCUTANEOUS

## 2016-04-03 MED ORDER — LOPERAMIDE HCL 2 MG PO CAPS
4.0000 mg | ORAL_CAPSULE | Freq: Three times a day (TID) | ORAL | Status: DC | PRN
  Administered 2016-04-03 – 2016-04-04 (×2): 4 mg via ORAL
  Filled 2016-04-03 (×2): qty 2

## 2016-04-03 MED ORDER — IOPAMIDOL (ISOVUE-300) INJECTION 61%
100.0000 mL | Freq: Once | INTRAVENOUS | Status: AC | PRN
  Administered 2016-04-03: 100 mL via INTRAVENOUS

## 2016-04-03 MED ORDER — ENSURE ENLIVE PO LIQD
237.0000 mL | Freq: Three times a day (TID) | ORAL | Status: DC
  Administered 2016-04-03 – 2016-04-06 (×9): 237 mL via ORAL

## 2016-04-03 MED ORDER — LEVOFLOXACIN IN D5W 750 MG/150ML IV SOLN
750.0000 mg | INTRAVENOUS | Status: DC
  Administered 2016-04-03 – 2016-04-04 (×2): 750 mg via INTRAVENOUS
  Filled 2016-04-03 (×2): qty 150

## 2016-04-03 MED ORDER — IOPAMIDOL (ISOVUE-300) INJECTION 61%
INTRAVENOUS | Status: AC
  Administered 2016-04-03: 30 mL
  Filled 2016-04-03: qty 30

## 2016-04-03 MED ORDER — SODIUM CHLORIDE 0.9 % IV SOLN
INTRAVENOUS | Status: DC
  Administered 2016-04-03 – 2016-04-04 (×2): via INTRAVENOUS

## 2016-04-03 MED ORDER — IOPAMIDOL (ISOVUE-300) INJECTION 61%
INTRAVENOUS | Status: AC
  Administered 2016-04-03: 30 mL
  Filled 2016-04-03: qty 100

## 2016-04-03 NOTE — Evaluation (Signed)
Clinical/Bedside Swallow Evaluation Patient Details  Name: Jake Chen MRN: [id number] Date of Birth: [birth year]  Today's Date: 04/03/2016 Time: SLP Start Time (ACUTE ONLY): 0845 SLP Stop Time (ACUTE ONLY): 0901 SLP Time Calculation (min) (ACUTE ONLY): 16 min  Past Medical History:  Past Medical History:  Diagnosis Date  . ADJ DISORDER WITH MIXED ANXIETY & DEPRESSED MOOD 02/23/2010   Qualifier: Diagnosis of  By: Jonny Ruiz MD, Len Blalock   . Anxiety and depression 07/19/2011  . CVA (cerebral vascular accident) ([facility])   . Depression 01/26/2011  . Diabetes ([facility]) 03/09/2014  . DIABETES MELLITUS, TYPE II 12/24/2006   Qualifier: Diagnosis of  By: Jonny Ruiz MD, Len Blalock   . DKA (diabetic ketoacidoses) ([facility])   . Erectile dysfunction 01/26/2011  . HYPERLIPIDEMIA 12/24/2006   Qualifier: Diagnosis of  By: Jonny Ruiz MD, Len Blalock   . HYPERTENSION 12/24/2006   Qualifier: Diagnosis of  By: Jonny Ruiz MD, Len Blalock   . PSA, INCREASED 12/25/2006   Qualifier: Diagnosis of  By: Jonny Ruiz MD, Len Blalock    Past Surgical History: History reviewed. No pertinent surgical history. HPI:  Jake Chen is a 66 y.o. male with medical history significant for diabetes, hypertension, hyperlipidemia, depression, CVA since emergency department with chief complaint of persistent diarrhea and unintentional weight loss. Initial evaluation in the emergency department reveals acute encephalopathy versus dementia in the setting of diabetic ketoacidosis, tachycardia, acute kidney injury.. CXR shows Left lower lobe atelectasis or pneumonia    Assessment / Plan / Recommendation Clinical Impression  Pt demosntrates minimal signs of aspiration with large sips of thin liquids. Oral motor movement for speech and swallowing appears discoordinated, mildly ataxic, accounting for slightly "drunken" slurred speech pattern. UE movement also discoordinated, pt over shoots targets. Pt is not a very good historian, seems to acknowlege these problems are new. No head  imaging yet available, but 1 view CXR suggestive of pneumonia. Will allow pt to continue current diet, but f/u with objective testing prior to d/c for diet recommendations and treatment program. RN aware.  SLP Visit Diagnosis: Dysphagia, oropharyngeal phase (R13.12)    Aspiration Risk  Mild aspiration risk    Diet Recommendation Regular;Thin liquid   Liquid Administration via: Cup Medication Administration: Whole meds with liquid Supervision: Patient able to self feed Compensations: Slow rate;Small sips/bites Postural Changes: Seated upright at 90 degrees    Other  Recommendations Oral Care Recommendations: Oral care BID   Follow up Recommendations Skilled Nursing facility      Frequency and Duration min 2x/week  2 weeks       Prognosis Prognosis for Safe Diet Advancement: Good      Swallow Study   General HPI: Jake Chen is a 66 y.o. male with medical history significant for diabetes, hypertension, hyperlipidemia, depression, CVA since emergency department with chief complaint of persistent diarrhea and unintentional weight loss. Initial evaluation in the emergency department reveals acute encephalopathy versus dementia in the setting of diabetic ketoacidosis, tachycardia, acute kidney injury.. CXR shows Left lower lobe atelectasis or pneumonia  Type of Study: Bedside Swallow Evaluation Diet Prior to this Study: Regular;Thin liquids Temperature Spikes Noted: No Respiratory Status: Room air History of Recent Intubation: No Behavior/Cognition: Alert;Cooperative Oral Cavity Assessment: Within Functional Limits Oral Care Completed by SLP: Yes Oral Cavity - Dentition: Adequate natural dentition Vision: Functional for self-feeding Patient Positioning: Upright in bed Baseline Vocal Quality: Normal Volitional Cough: Strong Volitional Swallow: Able to elicit    Oral/Motor/Sensory Function Overall Oral Motor/Sensory Function: Other (comment) (  ataxic)   Ice Chips Ice chips:  Not tested   Thin Liquid Thin Liquid: Impaired Presentation: Cup;Straw;Self Fed Pharyngeal  Phase Impairments: Cough - Immediate    Nectar Thick Nectar Thick Liquid: Not tested   Honey Thick Honey Thick Liquid: Not tested   Puree Puree: Within functional limits Presentation: Self Fed;Spoon   Solid   GO   Solid: Impaired Presentation: Self Fed Oral Phase Impairments: Impaired mastication Oral Phase Functional Implications: Impaired mastication Other Comments: ataxic       Harlon DittyBonnie Esteven Overfelt, MA CCC-SLP [phone]  [place] Clare, Riley NearingBonnie Caroline 04/03/2016,10:42 AM

## 2016-04-03 NOTE — Progress Notes (Signed)
Initial Nutrition Assessment  DOCUMENTATION CODES:   Obesity unspecified, Severe malnutrition in context of chronic illness  INTERVENTION:  Ensure Enlive TID between meals. Each supplement provides 350 kcal and 20 grams of protein.  Magic Cup TID with meals. Each supplement provides 290 kcal and 9 grams of protein.   NUTRITION DIAGNOSIS:   Malnutrition related to chronic illness as evidenced by percent weight loss, severe depletion of muscle mass, severe depletion of body fat.   GOAL:   Patient will meet greater than or equal to 90% of their needs  MONITOR:   Supplement acceptance, PO intake, Labs, Weight trends, I & O's  REASON FOR ASSESSMENT:   Consult Assessment of nutrition requirement/status  ASSESSMENT:    66 y.o. male with medical history significant for diabetes, hypertension, hyperlipidemia, depression, CVA since emergency department with chief complaint of persistent diarrhea and unintentional weight loss. Initial evaluation in the emergency department reveals acute encephalopathy versus dementia in the setting of diabetic ketoacidosis, tachycardia, acute kidney injury.  Patient acknowledged that speech is slow. Patient reported UBW of 150 lb and acknowledged 31 lb weight loss (21%) over the past 14 months. Patient reported appetite PTA was "good" and typically consisted of cereal with fruit for breakfast, chicken for lunch and meatloaf or bbq for dinner.   Patient reported appetite today was not good and agreed to supplementing current intake with Ensure Enlive. Patient reported ongoing diarrhea for 2 weeks, but was unable to identify the primary reason for weight loss.  Nutrition focused physical exam complete, findings were severe muscle depletion and severe fat depletion.  Meds Reviewed: Lipitor, Novolog, Lantus  Labs Reviewed: CBG 102 (H), Creatinine 0.57 (L), Calcium 8.2 (L)  Diet Order:  Diet Carb Modified Fluid consistency: Thin; Room service appropriate?  Yes  Skin:  Reviewed, no issues  Last BM:  3/25  Height:   Ht Readings from Last 1 Encounters:  04/02/16 6' (1.829 m)    Weight:   Wt Readings from Last 1 Encounters:  04/02/16 119 lb 4.3 oz (54.1 kg)    Ideal Body Weight:  80.9 kg  BMI:  Body mass index is 16.18 kg/m.  Estimated Nutritional Needs:   Kcal:  1600-1900  Protein:  80-95 grams  Fluid:  >/=1.5 L  EDUCATION NEEDS:   No education needs identified at this time  Rex KrasGrace Ann Vienna Folden M.S. Nutrition Dietetic Intern

## 2016-04-03 NOTE — Care Management Note (Signed)
Case Management Note  Patient Details  Name: Crissie FiguresRonald H Whitebread MRN: [id number] Date of Birth: August 17, 1950  Subjective/Objective:   From  Home alone, he has a PCP, Dr. Fayrene FearingJames, he states he does not have medication coverage.  He does not have any DME at home.  Presents with  DKA, diarrhea, unintentional weight loss, not eating, has dementia.  Psych consulted for capacity per MD and since not eating will start ivf's.  Per pt eval rec SNF,  NCM will cont to follow for dc needs.                   Action/Plan:   Expected Discharge Date:                  Expected Discharge Plan:  Skilled Nursing Facility  In-House Referral:  Clinical Social Work  Discharge planning Services  CM Consult  Post Acute Care Choice:    Choice offered to:     DME Arranged:    DME Agency:     HH Arranged:    HH Agency:     Status of Service:  In process, will continue to follow  If discussed at Long Length of Stay Meetings, dates discussed:    Additional Comments:  Leone Havenaylor, Wymon Swaney [place], RN 04/03/2016, 4:02 PM
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
## 2016-04-03 NOTE — Progress Notes (Signed)
Inpatient Diabetes Program Recommendations  AACE/ADA: New Consensus Statement on Inpatient Glycemic Control (2015)  Target Ranges:  Prepandial:   less than 140 mg/dL      Peak postprandial:   less than 180 mg/dL (1-2 hours)      Critically ill patients:  140 - 180 mg/dL   Lab Results  Component Value Date   GLUCAP 241 (H) 04/03/2016   HGBA1C 15.4 (H) 08/03/2014    Review of Glycemic Control Inpatient Diabetes Program Recommendations:  Attempted to speak with patient but patient sleeping soundly. Will plan to speak to patient tomorrow if appropriate. Nurse shared that patient said he had not taken any diabetes medications over the past year.  Thank you, Billy FischerJudy E. Desare Duddy, RN, MSN, CDE Inpatient Glycemic Control Team Team Pager [phone] (8am-5pm) 04/03/2016 2:17 PM

## 2016-04-03 NOTE — Telephone Encounter (Signed)
Pt came to office on 04/02/16. He did not have an appt schedule. Staff was called outside to find pt unable to get out of his car. Pt was able to tell me what month and day it was, as well as, DOB. Pt was transferred to wheelchair and brought inside to be evaluated by staff while waiting on EMS to get here. Pt was very frail and his hands were very uncared for. Each of his knuckles were cracked almost to bone due to dryness. Pt was unable to tell me when the last time was that he ate and stated that he was very weak and had had diarrhea for a week. [facility] Both children of the pt were notified via voicemail that pt was being transported.   Spoke with daughter and she states that they have been trying to get pt in assisted living or to move closer to his children, and for him to stop driving. Advised pt daughter to come to hospital follow-up with pt.

## 2016-04-03 NOTE — Telephone Encounter (Signed)
Daughter in law wanted to touch base in regard to father in law.  Does states that it would not be possible to bring father in law in for an appointment with Dr. Jonny RuizJohn in regard to his well being.  States that patient was admitted to the hospital and will probably be transferred to rehab afterwards.

## 2016-04-03 NOTE — Evaluation (Signed)
Physical Therapy Evaluation Patient Details Name: Jake FiguresRonald H Blakeman MRN: [id number] DOB: [birth year] Today's Date: 04/03/2016   History of Present Illness  patient is a 66 y.o. male with medical history significant for diabetes, hypertension, hyperlipidemia, depression, CVA since emergency department with chief complaint of persistent diarrhea and unintentional weight loss. Initial evaluation in the emergency department reveals acute encephalopathy versus dementia in the setting of diabetic ketoacidosis, tachycardia, acute kidney injury.. CXR shows Left lower lobe atelectasis or pneumonia    Clinical Impression  Patient seen for mobility and safety assessment. At this time patient demonstrates significant deficits in functional mobility as indicated below. Will need continued skilled PT to address deficits and maximize function. Will see as indicated and progress as tolerated.  Highly recommend ST SNF as patient is not safe for return home alone given significant risk of falls, deconditioning, and overt instability with all aspects of ambulation.    Follow Up Recommendations SNF;Supervision/Assistance - 24 hour    Equipment Recommendations  Rolling walker with 5" wheels    Recommendations for Other Services       Precautions / Restrictions Precautions Precautions: Fall Restrictions Weight Bearing Restrictions: No      Mobility  Bed Mobility Overal bed mobility: Needs Assistance Bed Mobility: Supine to Sit;Sit to Supine     Supine to sit: Min assist Sit to supine: Min assist   General bed mobility comments: significant effort with decreased exertion to come to EOB, increased time noted. patient with poor ability to power to upright due to overall weakness  Transfers Overall transfer level: Needs assistance Equipment used: 1 person hand held assist Transfers: Sit to/from Stand Sit to Stand: Min assist         General transfer comment: min assist to power up to standing, HHA  and supportive assist from therapiest to prevent fall  Ambulation/Gait Ambulation/Gait assistance: Min assist (2 overt LOB requriing moderate assist to prevent fall) Ambulation Distance (Feet): 30 Feet Assistive device: 1 person hand held assist Gait Pattern/deviations: Step-to pattern;Decreased stride length;Shuffle;Scissoring;Narrow base of support;Trunk flexed;Drifts right/left;Staggering right Gait velocity: decreased Gait velocity interpretation: Below normal speed for age/gender General Gait Details: patient very unsteady on feet, multiple overt LOB requiring increased physical assist to prevent fall.  Stairs            Wheelchair Mobility    Modified Rankin (Stroke Patients Only)       Balance Overall balance assessment: Needs assistance   Sitting balance-Leahy Scale: Fair       Standing balance-Leahy Scale: Poor Standing balance comment: patient with increased sway and poor ability to maintain static balance, LOB noted with any activity outside BOS                             Pertinent Vitals/Pain Pain Assessment: Faces Faces Pain Scale: Hurts little more (6 for wounds on knuckles) Pain Location: buttocks and back Pain Descriptors / Indicators: Sore Pain Intervention(s): Monitored during session    Home Living Family/patient expects to be discharged to:: Private residence Living Arrangements: Alone Available Help at Discharge: Family;Friend(s);Available PRN/intermittently Type of Home:  (condo) Home Access: Stairs to enter Entrance Stairs-Rails: Right Entrance Stairs-Number of Steps: patient lives 3rd floor condo 2 flights 10 steps Home Layout: Two level;Able to live on main level with bedroom/bathroom Home Equipment: None      Prior Function Level of Independence: Independent         Comments: reports no use of  assistive device, but acknowledges very weak     Hand Dominance   Dominant Hand: Left    Extremity/Trunk Assessment    Upper Extremity Assessment Upper Extremity Assessment: Generalized weakness    Lower Extremity Assessment Lower Extremity Assessment: Generalized weakness    Cervical / Trunk Assessment Cervical / Trunk Assessment:  (cachetic appearance)  Communication   Communication:  (dysarthric)  Cognition Arousal/Alertness: Awake/alert Behavior During Therapy: Flat affect Overall Cognitive Status: No family/caregiver present to determine baseline cognitive functioning                                        General Comments      Exercises     Assessment/Plan    PT Assessment Patient needs continued PT services  PT Problem List Decreased strength;Decreased activity tolerance;Decreased balance;Decreased mobility;Decreased coordination;Decreased cognition;Decreased safety awareness;Decreased knowledge of use of DME;Pain       PT Treatment Interventions DME instruction;Gait training;Stair training;Functional mobility training;Therapeutic activities;Therapeutic exercise;Balance training;Patient/family education;Cognitive remediation;Manual techniques    PT Goals (Current goals can be found in the Care Plan section)  Acute Rehab PT Goals Patient Stated Goal: to get stronger PT Goal Formulation: With patient Time For Goal Achievement: 04/17/16 Potential to Achieve Goals: Fair    Frequency Min 3X/week   Barriers to discharge        Co-evaluation               End of Session Equipment Utilized During Treatment: Gait belt Activity Tolerance: Patient limited by fatigue Patient left: in bed;with call bell/phone within reach;with bed alarm set Nurse Communication: Mobility status PT Visit Diagnosis: Unsteadiness on feet (R26.81);Ataxic gait (R26.0);Muscle weakness (generalized) (M62.81)    Time: 4098-1191 PT Time Calculation (min) (ACUTE ONLY): 19 min   Charges:   PT Evaluation $PT Eval Moderate Complexity: 1 Procedure     PT G Codes:   PT G-Codes **NOT FOR  INPATIENT CLASS** Functional Assessment Tool Used: Clinical judgement Functional Limitation: Mobility: Walking and moving around Mobility: Walking and Moving Around Current Status ([id number]): At least 20 percent but less than 40 percent impaired, limited or restricted Mobility: Walking and Moving Around Goal Status [phone]): At least 1 percent but less than 20 percent impaired, limited or restricted    Charlotte Crumb, PT DPT  [phone]   Fabio Asa 04/03/2016, 3:13 PM

## 2016-04-03 NOTE — Consult Note (Signed)
[facility] Consult   Reason for Consult:  Mental ability assessment Referring Physician:  [facility] Patient Identification: Jake Chen MRN:  [id number] Principal Diagnosis: Adjustment disorder with mixed anxiety and depressed mood Diagnosis:   Patient Active Problem List   Diagnosis Date Noted  . ADJ DISORDER WITH MIXED ANXIETY & DEPRESSED MOOD [F43.23] 02/23/2010    Priority: High  . Protein-calorie malnutrition, severe [E43] 04/03/2016  . DKA (diabetic ketoacidoses) ([place]) [E13.10] 04/02/2016  . Acute kidney injury ([place]) [N17.9] 04/02/2016  . Encephalopathy [G93.40] 04/02/2016  . Diarrhea [R19.7] 04/02/2016  . Unintentional weight loss [R63.4] 04/02/2016  . Acute metabolic encephalopathy [A07.62]   . Dysarthria [R47.1]   . CVA (cerebral infarction) [I63.9] 08/02/2014  . Uncontrolled diabetes mellitus without complication ([place]) [U63.33] 03/09/2014  . Anxiety and depression [F41.8] 07/19/2011  . Preventative health care [Z00.00] 01/26/2011  . Depression [F32.9] 01/26/2011  . Erectile dysfunction [N52.9] 01/26/2011  . WEIGHT LOSS [R63.4] 06/24/2007  . PSA, INCREASED [R97.20] 12/25/2006  . Hyperlipidemia [E78.5] 12/24/2006  . HYPERLIPIDEMIA [E78.5] 12/24/2006  . Anxiety state [F41.1] 12/24/2006  . DEPRESSION [F32.9] 12/24/2006  . Essential hypertension [I10] 12/24/2006    Total Time spent with patient: 40 minutes  Subjective:   Jake Chen is a 66 y.o. male patient admitted with reports of possible acute confusion and impaired decision-making. Pt seen and chart reviewed. Pt is alert/oriented x4, calm, cooperative, and appropriate to situation. Pt denies suicidal/homicidal ideation and psychosis and does not appear to be responding to internal stimuli. Pt reports that he has had loose stools for 2 weeks and was very dehydrated and ill. When asked about depression, pt does report that he has been eating less due to feeling "down" but reports that he would never  harm himself and that he has a supportive daughter who lives in [place].   Through detailed discussion, pt demonstrated a mental ability to make informed decisions. Pt was able to evaluate risk/benefit ratios of routine testing, wound care, infection treatment, IV access, hydration, and other scenarios in addition to simple and moderately complex math equations. Pt also independently weighed the risk/reward regarding expense of medical treatments if it were to be necessary to attend a rehabilitation facility and whether they would accept his insurance. There are no evident signs of dementia, acute confusion, or delirium at this time. However, due to pt's slow speech which he reports is from a CVA 4 years ago, it could be presumed at first impression that he does have a mental impairment. This appears to be isolated to slow speech only.   HPI:  I have reviewed and concur with HPI elements below, modified as follows:  "Jake Chen is a 67 y.o. male brought in by ambulance, who presents to the Emergency Department complaining of intermittent diarrhea for the past 2 weeks. He reports associated weight loss which he as well as others around him have noticed. He states he has been drinking fluids well and eating well up until 2-3 days ago when he stopped eating solid foods. He denies any body pain, nausea, blood in stool. He denies sick contacts with similar symptoms. He has a history of a CVA but without any residual deficits. He states his speech is more slurred than usual which he believes is related to a dry mouth. He denies new weakness, numbness, or paresthesia"  [facility] hospitalist requested a mental ability assessment due to family concern. See evaluation above today on 04/03/16. Nursing staff report that pt is lucid and  appropriate.   Past Psychiatric History: depression  Risk to Self: Is patient at risk for suicide?: No Risk to Others:   Prior Inpatient Therapy:   Prior Outpatient Therapy:    Past  Medical History:  Past Medical History:  Diagnosis Date  . ADJ DISORDER WITH MIXED ANXIETY & DEPRESSED MOOD 02/23/2010   Qualifier: Diagnosis of  By: Jenny Reichmann MD, Hunt Oris   . Anxiety and depression 07/19/2011  . CVA (cerebral vascular accident) ([place])   . Depression 01/26/2011  . Diabetes ([place]) 03/09/2014  . DIABETES MELLITUS, TYPE II 12/24/2006   Qualifier: Diagnosis of  By: Jenny Reichmann MD, Hunt Oris   . DKA (diabetic ketoacidoses) ([place])   . Erectile dysfunction 01/26/2011  . HYPERLIPIDEMIA 12/24/2006   Qualifier: Diagnosis of  By: Jenny Reichmann MD, Hunt Oris   . HYPERTENSION 12/24/2006   Qualifier: Diagnosis of  By: Jenny Reichmann MD, Hunt Oris   . PSA, INCREASED 12/25/2006   Qualifier: Diagnosis of  By: Jenny Reichmann MD, Hunt Oris    History reviewed. No pertinent surgical history. Family History:  Family History  Problem Relation Age of Onset  . Cancer Mother   . Cancer Father    Family Psychiatric  History: denies Social History:  History  Alcohol Use No     History  Drug Use No    Social History   Social History  . Marital status: Divorced    Spouse name: N/A  . Number of children: N/A  . Years of education: N/A   Social History Main Topics  . Smoking status: Never Smoker  . Smokeless tobacco: Never Used  . Alcohol use No  . Drug use: No  . Sexual activity: Yes   Other Topics Concern  . None   Social History Narrative  . None   Additional Social History:    Allergies:  No Known Allergies  Labs:  Results for orders placed or performed during the hospital encounter of 04/02/16 (from the past 48 hour(s))  Comprehensive metabolic panel     Status: Abnormal   Collection Time: 04/02/16 12:19 PM  Result Value Ref Range   Sodium 138 135 - 145 mmol/L   Potassium 4.4 3.5 - 5.1 mmol/L   Chloride 101 101 - 111 mmol/L   CO2 15 (L) 22 - 32 mmol/L   Glucose, Bld 391 (H) 65 - 99 mg/dL   BUN 43 (H) 6 - 20 mg/dL   Creatinine, Ser 1.55 (H) 0.61 - 1.24 mg/dL   Calcium 9.1 8.9 - 10.3 mg/dL   Total Protein 7.4  6.5 - 8.1 g/dL   Albumin 2.7 (L) 3.5 - 5.0 g/dL   AST 15 15 - 41 U/L   ALT 17 17 - 63 U/L   Alkaline Phosphatase 103 38 - 126 U/L   Total Bilirubin 2.1 (H) 0.3 - 1.2 mg/dL   GFR calc non Af Amer 45 (L) >60 mL/min   GFR calc Af Amer 52 (L) >60 mL/min    Comment: (NOTE) The eGFR has been calculated using the CKD EPI equation. This calculation has not been validated in all clinical situations. eGFR's persistently <60 mL/min signify possible Chronic Kidney Disease.    Anion gap 22 (H) 5 - 15  CBC with Differential     Status: Abnormal   Collection Time: 04/02/16 12:19 PM  Result Value Ref Range   WBC 13.0 (H) 4.0 - 10.5 K/uL   RBC 4.52 4.22 - 5.81 MIL/uL   Hemoglobin 13.2 13.0 - 17.0 g/dL   HCT 39.7  39.0 - 52.0 %   MCV 87.8 78.0 - 100.0 fL   MCH 29.2 26.0 - 34.0 pg   MCHC 33.2 30.0 - 36.0 g/dL   RDW 14.1 11.5 - 15.5 %   Platelets 327 150 - 400 K/uL   Neutrophils Relative % 92 %   Lymphocytes Relative 4 %   Monocytes Relative 4 %   Eosinophils Relative 0 %   Basophils Relative 0 %   Neutro Abs 12.0 (H) 1.7 - 7.7 K/uL   Lymphs Abs 0.5 (L) 0.7 - 4.0 K/uL   Monocytes Absolute 0.5 0.1 - 1.0 K/uL   Eosinophils Absolute 0.0 0.0 - 0.7 K/uL   Basophils Absolute 0.0 0.0 - 0.1 K/uL   Smear Review MORPHOLOGY UNREMARKABLE   Urinalysis, Routine w reflex microscopic     Status: Abnormal   Collection Time: 04/02/16  1:01 PM  Result Value Ref Range   Color, Urine YELLOW YELLOW   APPearance HAZY (A) CLEAR   Specific Gravity, Urine 1.024 1.005 - 1.030   pH 5.0 5.0 - 8.0   Glucose, UA >=500 (A) NEGATIVE mg/dL   Hgb urine dipstick NEGATIVE NEGATIVE   Bilirubin Urine NEGATIVE NEGATIVE   Ketones, ur 80 (A) NEGATIVE mg/dL   Protein, ur NEGATIVE NEGATIVE mg/dL   Nitrite NEGATIVE NEGATIVE   Leukocytes, UA NEGATIVE NEGATIVE   RBC / HPF NONE SEEN 0 - 5 RBC/hpf   WBC, UA 0-5 0 - 5 WBC/hpf   Bacteria, UA NONE SEEN NONE SEEN   Squamous Epithelial / LPF 0-5 (A) NONE SEEN   Hyaline Casts, UA  PRESENT   CBG monitoring, ED     Status: Abnormal   Collection Time: 04/02/16  3:24 PM  Result Value Ref Range   Glucose-Capillary 306 (H) 65 - 99 mg/dL  CBG monitoring, ED     Status: Abnormal   Collection Time: 04/02/16  4:34 PM  Result Value Ref Range   Glucose-Capillary 253 (H) 65 - 99 mg/dL  MRSA PCR Screening     Status: None   Collection Time: 04/02/16  5:27 PM  Result Value Ref Range   MRSA by PCR NEGATIVE NEGATIVE    Comment:        The GeneXpert MRSA Assay (FDA approved for NASAL specimens only), is one component of a comprehensive MRSA colonization surveillance program. It is not intended to diagnose MRSA infection nor to guide or monitor treatment for MRSA infections.   Glucose, capillary     Status: Abnormal   Collection Time: 04/02/16  5:35 PM  Result Value Ref Range   Glucose-Capillary 226 (H) 65 - 99 mg/dL  Basic metabolic panel     Status: Abnormal   Collection Time: 04/02/16  6:23 PM  Result Value Ref Range   Sodium 141 135 - 145 mmol/L   Potassium 3.6 3.5 - 5.1 mmol/L    Comment: DELTA CHECK NOTED   Chloride 103 101 - 111 mmol/L   CO2 25 22 - 32 mmol/L   Glucose, Bld 142 (H) 65 - 99 mg/dL   BUN 34 (H) 6 - 20 mg/dL   Creatinine, Ser 0.96 0.61 - 1.24 mg/dL   Calcium 8.5 (L) 8.9 - 10.3 mg/dL   GFR calc non Af Amer >60 >60 mL/min   GFR calc Af Amer >60 >60 mL/min    Comment: (NOTE) The eGFR has been calculated using the CKD EPI equation. This calculation has not been validated in all clinical situations. eGFR's persistently <60 mL/min signify possible Chronic Kidney  Disease.    Anion gap 13 5 - 15  Vitamin B12     Status: None   Collection Time: 04/02/16  6:23 PM  Result Value Ref Range   Vitamin B-12 689 180 - 914 pg/mL    Comment: (NOTE) This assay is not validated for testing neonatal or myeloproliferative syndrome specimens for Vitamin B12 levels.   Folate RBC     Status: Abnormal   Collection Time: 04/02/16  6:23 PM  Result Value Ref  Range   Folate, Hemolysate 438.6 Not Estab. ng/mL   Hematocrit 35.8 (L) 37.5 - 51.0 %   Folate, RBC 1,225 >498 ng/mL    Comment: (NOTE) Performed At: [facility] [place], [place] [id number] Lindon Romp MD UO:[id number]   RPR     Status: None   Collection Time: 04/02/16  6:23 PM  Result Value Ref Range   RPR Ser Ql Non Reactive Non Reactive    Comment: (NOTE) Performed At: [facility] [place], [place] [id number] Lindon Romp MD HV:[id number]   Troponin I     Status: Abnormal   Collection Time: 04/02/16  6:23 PM  Result Value Ref Range   Troponin I 0.14 (HH) <0.03 ng/mL    Comment: CRITICAL RESULT CALLED TO, READ BACK BY AND VERIFIED WITH: T HOOD,RN 2002 04/02/16 D BRADLEY   TSH     Status: None   Collection Time: 04/02/16  6:23 PM  Result Value Ref Range   TSH 0.931 0.350 - 4.500 uIU/mL    Comment: Performed by a 3rd Generation assay with a functional sensitivity of <=0.01 uIU/mL.  Glucose, capillary     Status: Abnormal   Collection Time: 04/02/16  6:46 PM  Result Value Ref Range   Glucose-Capillary 181 (H) 65 - 99 mg/dL  Glucose, capillary     Status: Abnormal   Collection Time: 04/02/16  7:40 PM  Result Value Ref Range   Glucose-Capillary 139 (H) 65 - 99 mg/dL  Glucose, capillary     Status: Abnormal   Collection Time: 04/02/16  9:17 PM  Result Value Ref Range   Glucose-Capillary 61 (L) 65 - 99 mg/dL  Glucose, capillary     Status: None   Collection Time: 04/02/16 10:00 PM  Result Value Ref Range   Glucose-Capillary 91 65 - 99 mg/dL  Basic metabolic panel     Status: Abnormal   Collection Time: 04/02/16 10:19 PM  Result Value Ref Range   Sodium 139 135 - 145 mmol/L   Potassium 3.9 3.5 - 5.1 mmol/L   Chloride 105 101 - 111 mmol/L   CO2 23 22 - 32 mmol/L   Glucose, Bld 129 (H) 65 - 99 mg/dL   BUN 27 (H) 6 - 20 mg/dL   Creatinine, Ser 0.74 0.61 - 1.24 mg/dL   Calcium 8.2 (L) 8.9 - 10.3 mg/dL   GFR calc non  Af Amer >60 >60 mL/min   GFR calc Af Amer >60 >60 mL/min    Comment: (NOTE) The eGFR has been calculated using the CKD EPI equation. This calculation has not been validated in all clinical situations. eGFR's persistently <60 mL/min signify possible Chronic Kidney Disease.    Anion gap 11 5 - 15  Glucose, capillary     Status: Abnormal   Collection Time: 04/02/16 11:10 PM  Result Value Ref Range   Glucose-Capillary 152 (H) 65 - 99 mg/dL  Glucose, capillary     Status: Abnormal   Collection Time:  04/03/16 12:24 AM  Result Value Ref Range   Glucose-Capillary 159 (H) 65 - 99 mg/dL  Troponin I     Status: Abnormal   Collection Time: 04/03/16 12:51 AM  Result Value Ref Range   Troponin I 0.09 (HH) <0.03 ng/mL    Comment: CRITICAL VALUE NOTED.  VALUE IS CONSISTENT WITH PREVIOUSLY REPORTED AND CALLED VALUE.  Basic metabolic panel     Status: Abnormal   Collection Time: 04/03/16 12:51 AM  Result Value Ref Range   Sodium 136 135 - 145 mmol/L   Potassium 3.8 3.5 - 5.1 mmol/L   Chloride 104 101 - 111 mmol/L   CO2 23 22 - 32 mmol/L   Glucose, Bld 162 (H) 65 - 99 mg/dL   BUN 26 (H) 6 - 20 mg/dL   Creatinine, Ser 0.71 0.61 - 1.24 mg/dL   Calcium 8.2 (L) 8.9 - 10.3 mg/dL   GFR calc non Af Amer >60 >60 mL/min   GFR calc Af Amer >60 >60 mL/min    Comment: (NOTE) The eGFR has been calculated using the CKD EPI equation. This calculation has not been validated in all clinical situations. eGFR's persistently <60 mL/min signify possible Chronic Kidney Disease.    Anion gap 9 5 - 15  Glucose, capillary     Status: Abnormal   Collection Time: 04/03/16  1:28 AM  Result Value Ref Range   Glucose-Capillary 171 (H) 65 - 99 mg/dL   Comment 1 Notify RN    Comment 2 Document in Chart   Troponin I     Status: Abnormal   Collection Time: 04/03/16  7:55 AM  Result Value Ref Range   Troponin I 0.07 (HH) <0.03 ng/mL    Comment: CRITICAL VALUE NOTED.  VALUE IS CONSISTENT WITH PREVIOUSLY REPORTED AND  CALLED VALUE.  Basic metabolic panel     Status: Abnormal   Collection Time: 04/03/16  7:55 AM  Result Value Ref Range   Sodium 139 135 - 145 mmol/L   Potassium 3.7 3.5 - 5.1 mmol/L   Chloride 106 101 - 111 mmol/L   CO2 24 22 - 32 mmol/L   Glucose, Bld 107 (H) 65 - 99 mg/dL   BUN 20 6 - 20 mg/dL   Creatinine, Ser 0.57 (L) 0.61 - 1.24 mg/dL   Calcium 8.2 (L) 8.9 - 10.3 mg/dL   GFR calc non Af Amer >60 >60 mL/min   GFR calc Af Amer >60 >60 mL/min    Comment: (NOTE) The eGFR has been calculated using the CKD EPI equation. This calculation has not been validated in all clinical situations. eGFR's persistently <60 mL/min signify possible Chronic Kidney Disease.    Anion gap 9 5 - 15  Glucose, capillary     Status: Abnormal   Collection Time: 04/03/16  8:26 AM  Result Value Ref Range   Glucose-Capillary 102 (H) 65 - 99 mg/dL  C difficile quick scan w PCR reflex     Status: None   Collection Time: 04/03/16 10:07 AM  Result Value Ref Range   C Diff antigen NEGATIVE NEGATIVE   C Diff toxin NEGATIVE NEGATIVE   C Diff interpretation No C. difficile detected.   Glucose, capillary     Status: Abnormal   Collection Time: 04/03/16 12:26 PM  Result Value Ref Range   Glucose-Capillary 241 (H) 65 - 99 mg/dL  Troponin I     Status: Abnormal   Collection Time: 04/03/16 12:32 PM  Result Value Ref Range   Troponin I  0.05 (HH) <0.03 ng/mL    Comment: CRITICAL VALUE NOTED.  VALUE IS CONSISTENT WITH PREVIOUSLY REPORTED AND CALLED VALUE.  CBC     Status: Abnormal   Collection Time: 04/03/16 12:32 PM  Result Value Ref Range   WBC 12.2 (H) 4.0 - 10.5 K/uL   RBC 4.12 (L) 4.22 - 5.81 MIL/uL   Hemoglobin 11.8 (L) 13.0 - 17.0 g/dL   HCT 35.8 (L) 39.0 - 52.0 %   MCV 86.9 78.0 - 100.0 fL   MCH 28.6 26.0 - 34.0 pg   MCHC 33.0 30.0 - 36.0 g/dL   RDW 14.0 11.5 - 15.5 %   Platelets 269 150 - 400 K/uL  Procalcitonin - Baseline     Status: None   Collection Time: 04/03/16 12:32 PM  Result Value Ref  Range   Procalcitonin 2.97 ng/mL    Comment:        Interpretation: PCT > 2 ng/mL: Systemic infection (sepsis) is likely, unless other causes are known. (NOTE)         ICU PCT Algorithm               Non ICU PCT Algorithm    ----------------------------     ------------------------------         PCT < 0.25 ng/mL                 PCT < 0.1 ng/mL     Stopping of antibiotics            Stopping of antibiotics       strongly encouraged.               strongly encouraged.    ----------------------------     ------------------------------       PCT level decrease by               PCT < 0.25 ng/mL       >= 80% from peak PCT       OR PCT 0.25 - 0.5 ng/mL          Stopping of antibiotics                                             encouraged.     Stopping of antibiotics           encouraged.    ----------------------------     ------------------------------       PCT level decrease by              PCT >= 0.25 ng/mL       < 80% from peak PCT        AND PCT >= 0.5 ng/mL            Continuing antibiotics                                               encouraged.       Continuing antibiotics            encouraged.    ----------------------------     ------------------------------     PCT level increase compared          PCT > 0.5 ng/mL         with  peak PCT AND          PCT >= 0.5 ng/mL             Escalation of antibiotics                                          strongly encouraged.      Escalation of antibiotics        strongly encouraged.   Glucose, capillary     Status: Abnormal   Collection Time: 04/03/16  5:26 PM  Result Value Ref Range   Glucose-Capillary 168 (H) 65 - 99 mg/dL  Troponin I     Status: None   Collection Time: 04/03/16  6:22 PM  Result Value Ref Range   Troponin I <0.03 <0.03 ng/mL    Current Facility-Administered Medications  Medication Dose Route Frequency Provider Last Rate Last Dose  . 0.9 %  sodium chloride infusion   Intravenous Continuous Ripudeep Krystal Eaton,  MD 100 mL/hr at 04/03/16 1705    . aspirin tablet 325 mg  325 mg Oral Daily Radene Gunning, NP   325 mg at 04/03/16 9767  . atorvastatin (LIPITOR) tablet 40 mg  40 mg Oral q1800 Radene Gunning, NP   40 mg at 04/03/16 1705  . cetaphil (CETAPHIL) lotion   Topical BID Waldemar Dickens, MD      . feeding supplement (ENSURE ENLIVE) (ENSURE ENLIVE) liquid 237 mL  237 mL Oral TID BM Ripudeep K Rai, MD   237 mL at 04/03/16 1422  . heparin injection 5,000 Units  5,000 Units Subcutaneous Q8H Radene Gunning, NP   5,000 Units at 04/03/16 1421  . insulin aspart (novoLOG) injection 0-9 Units  0-9 Units Subcutaneous TID WC Jeryl Columbia, NP   2 Units at 04/03/16 1748  . insulin glargine (LANTUS) injection 10 Units  10 Units Subcutaneous QHS Jeryl Columbia, NP   10 Units at 04/03/16 0026  . levofloxacin (LEVAQUIN) IVPB 750 mg  750 mg Intravenous Q24H Ripudeep K Rai, MD   750 mg at 04/03/16 1705  . loperamide (IMODIUM) capsule 4 mg  4 mg Oral TID PRN Ripudeep Krystal Eaton, MD   4 mg at 04/03/16 1705  . MEDLINE mouth rinse  15 mL Mouth Rinse BID Waldemar Dickens, MD   15 mL at 04/03/16 0946  . metoprolol tartrate (LOPRESSOR) tablet 12.5 mg  12.5 mg Oral BID Radene Gunning, NP   12.5 mg at 04/03/16 0941  . mupirocin ointment (BACTROBAN) 2 %   Topical BID Waldemar Dickens, MD        Musculoskeletal: Strength & Muscle Tone: decreased Gait & Station: normal Patient leans: N/A  Psychiatric Specialty Exam: Physical Exam  Review of Systems  Psychiatric/Behavioral: Positive for depression. Negative for hallucinations, substance abuse and suicidal ideas. The patient is nervous/anxious. The patient does not have insomnia.   All other systems reviewed and are negative.   Blood pressure 127/80, pulse 92, temperature 98.8 F (37.1 C), temperature source Oral, resp. rate (!) 23, height 6' (1.829 m), weight 54.1 kg (119 lb 4.3 oz), SpO2 94 %.Body mass index is 16.18 kg/m.  General Appearance: Casual and Fairly Groomed  Eye  Contact:  Good  Speech:  Clear and Coherent and Normal Rate  Volume:  Normal  Mood:  Anxious and Depressed  Affect:  Appropriate, Congruent and Depressed  Thought Process:  Coherent, Goal Directed, Linear and Descriptions of Associations: Loose  Orientation:  Full (Time, Place, and Person)  Thought Content:  Focused on health treatment options  Suicidal Thoughts:  No  Homicidal Thoughts:  No  Memory:  Immediate;   Fair Recent;   Fair Remote;   Fair  Judgement:  Fair  Insight:  Fair  Psychomotor Activity:  Normal  Concentration:  Concentration: Fair and Attention Span: Fair  Recall:  [employer] of Knowledge:  Fair  Language:  Fair  Akathisia:  No  Handed:    AIMS (if indicated):     Assets:  Communication Skills Desire for Improvement Resilience Social Support  ADL's:  Intact  Cognition:  WNL  Sleep:      Treatment Plan Summary: Adjustment disorder with mixed anxiety and depressed mood stable for outpatient psychiatric management; would benefit from outpatient therapy after medical clearance  *This pt does possess the mental ability to make informed medical decisions including routine testing and placement options if necessary. Procedure/surgical consent may vary depending on complexity.   Disposition: No evidence of imminent risk to self or others at present.   Patient does not meet criteria for psychiatric inpatient admission. Supportive therapy provided about ongoing stressors. Pt will benefit from outpatient therapy referral for depression  Benjamine Mola, FNP 04/03/2016 4:41 PM

## 2016-04-03 NOTE — Telephone Encounter (Signed)
Routing to dr john, fyi 

## 2016-04-04 ENCOUNTER — Other Ambulatory Visit (HOSPITAL_COMMUNITY): Payer: Medicare Other

## 2016-04-04 ENCOUNTER — Inpatient Hospital Stay (HOSPITAL_COMMUNITY): Payer: Medicare Other

## 2016-04-04 DIAGNOSIS — F4323 Adjustment disorder with mixed anxiety and depressed mood: Secondary | ICD-10-CM

## 2016-04-04 LAB — GASTROINTESTINAL PANEL BY PCR, STOOL (REPLACES STOOL CULTURE)
Adenovirus F40/41: NOT DETECTED
Astrovirus: NOT DETECTED
CYCLOSPORA CAYETANENSIS: NOT DETECTED
Campylobacter species: NOT DETECTED
Cryptosporidium: NOT DETECTED
ENTAMOEBA HISTOLYTICA: NOT DETECTED
ENTEROAGGREGATIVE E COLI (EAEC): NOT DETECTED
Enteropathogenic E coli (EPEC): DETECTED — AB
Enterotoxigenic E coli (ETEC): NOT DETECTED
GIARDIA LAMBLIA: NOT DETECTED
NOROVIRUS GI/GII: NOT DETECTED
Plesimonas shigelloides: NOT DETECTED
Rotavirus A: NOT DETECTED
SALMONELLA SPECIES: NOT DETECTED
SAPOVIRUS (I, II, IV, AND V): NOT DETECTED
SHIGA LIKE TOXIN PRODUCING E COLI (STEC): NOT DETECTED
Shigella/Enteroinvasive E coli (EIEC): NOT DETECTED
VIBRIO CHOLERAE: NOT DETECTED
VIBRIO SPECIES: NOT DETECTED
Yersinia enterocolitica: NOT DETECTED

## 2016-04-04 LAB — BASIC METABOLIC PANEL
ANION GAP: 10 (ref 5–15)
BUN: 15 mg/dL (ref 6–20)
CO2: 24 mmol/L (ref 22–32)
Calcium: 8.3 mg/dL — ABNORMAL LOW (ref 8.9–10.3)
Chloride: 102 mmol/L (ref 101–111)
Creatinine, Ser: 0.51 mg/dL — ABNORMAL LOW (ref 0.61–1.24)
GLUCOSE: 80 mg/dL (ref 65–99)
Potassium: 3.5 mmol/L (ref 3.5–5.1)
SODIUM: 136 mmol/L (ref 135–145)

## 2016-04-04 LAB — GLUCOSE, CAPILLARY
GLUCOSE-CAPILLARY: 88 mg/dL (ref 65–99)
Glucose-Capillary: 206 mg/dL — ABNORMAL HIGH (ref 65–99)

## 2016-04-04 LAB — CBC
HCT: 33.7 % — ABNORMAL LOW (ref 39.0–52.0)
Hemoglobin: 10.9 g/dL — ABNORMAL LOW (ref 13.0–17.0)
MCH: 27.9 pg (ref 26.0–34.0)
MCHC: 32.3 g/dL (ref 30.0–36.0)
MCV: 86.2 fL (ref 78.0–100.0)
PLATELETS: 265 10*3/uL (ref 150–400)
RBC: 3.91 MIL/uL — AB (ref 4.22–5.81)
RDW: 13.5 % (ref 11.5–15.5)
WBC: 7.7 10*3/uL (ref 4.0–10.5)

## 2016-04-04 MED ORDER — RESOURCE THICKENUP CLEAR PO POWD
ORAL | Status: DC | PRN
  Filled 2016-04-04: qty 125

## 2016-04-04 NOTE — Progress Notes (Signed)
Patient down for MRI

## 2016-04-04 NOTE — Progress Notes (Signed)
Thank you for consult on Mr. Jake Chen. Chart reviewed. Patient was admitted with DKA, ongoing diarrhea, PNA and FTT.  He has history of falls, non-compliance and family requesting SNF after discharge. PT/OT evaluations done and concur with recommendations of SNF for follow up therapy. Will defer CIR consult.

## 2016-04-04 NOTE — Plan of Care (Signed)
Problem: Skin Integrity: Goal: Risk for impaired skin integrity will decrease Outcome: Progressing Patient turns self at intervals.   Problem: Bowel/Gastric: Goal: Will not experience complications related to bowel motility Outcome: Progressing Patient currently on enteric precautions , C diff was negative so patient given imodium to help decrease liquid stools. Patient GI panel still pending. Only one bowel movement on night shift.
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
## 2016-04-04 NOTE — Progress Notes (Signed)
04/04/16 1100  SLP Visit Information  SLP Received On 04/04/16  General Information  HPI Jake Chen is a 66 y.o. male with medical history significant for diabetes, hypertension, hyperlipidemia, depression, CVA since emergency department with chief complaint of persistent diarrhea and unintentional weight loss. Initial evaluation in the emergency department reveals acute encephalopathy versus dementia in the setting of diabetic ketoacidosis, tachycardia, acute kidney injury.. CXR shows Left lower lobe atelectasis or pneumonia   Type of Study MBS-Modified Barium Swallow Study  Previous Swallow Assessment none  Diet Prior to this Study Regular;Thin liquids  Temperature Spikes Noted No  Respiratory Status Room air  History of Recent Intubation No  Behavior/Cognition Alert;Cooperative  Oral Cavity Assessment WFL  Oral Care Completed by SLP No  Oral Cavity - Dentition Adequate natural dentition  Vision Functional for self feeding  Self-Feeding Abilities Able to feed self  Patient Positioning Upright in chair  Baseline Vocal Quality Normal  Volitional Cough Strong  Volitional Swallow Able to elicit  Anatomy [facility]  Pharyngeal Secretions Not observed secondary MBS  Oral Motor/Sensory Function  Overall Oral Motor/Sensory Function Other (comment) (ataxic)  Oral Preparation/Oral Phase  Oral Phase WFL  Pharyngeal Phase  Pharyngeal Phase Impaired  Pharyngeal - Nectar  Pharyngeal- Nectar Cup Reduced epiglottic inversion;Reduced anterior laryngeal mobility;Reduced tongue base retraction;Penetration/Aspiration during swallow;Pharyngeal residue - valleculae;Pharyngeal residue - pyriform;Compensatory strategies attempted (with notebox) (head turn, multiple swallows)  Pharyngeal- Nectar Straw Reduced epiglottic inversion;Reduced anterior laryngeal mobility;Reduced tongue base retraction;Penetration/Aspiration during swallow;Pharyngeal residue - valleculae;Pharyngeal residue -  pyriform;Compensatory strategies attempted (with notebox)  Pharyngeal Material enters airway, remains ABOVE vocal cords then ejected out;Material does not enter airway  Pharyngeal Material enters airway, remains ABOVE vocal cords then ejected out;Material does not enter airway  Pharyngeal - Thin  Pharyngeal- Thin Cup Reduced epiglottic inversion;Reduced anterior laryngeal mobility;Reduced tongue base retraction;Penetration/Apiration after swallow;Moderate aspiration;Pharyngeal residue - valleculae;Pharyngeal residue - pyriform;Compensatory strategies attempted (with notebox) (chin tuck, head turn ,swallow twice, supraglottic. )  Pharyngeal - Solids  Pharyngeal- Puree Reduced epiglottic inversion;Reduced anterior laryngeal mobility;Reduced tongue base retraction;Pharyngeal residue - valleculae;Pharyngeal residue - pyriform  Pharyngeal- Regular Reduced epiglottic inversion;Reduced anterior laryngeal mobility;Reduced tongue base retraction;Pharyngeal residue - valleculae;Pharyngeal residue - pyriform  Clinical Impression  Clinical Impression Pt demonstrates a severe oropharygneal and cervical esopahgeal dysphagia in which mobility of hyolaryngeal musculature is minimally decreased in movement at the height of the swallow, impacting bolus flow through the pharynx and UES. Though base of tongue propulsion, hyolaryngeal excursion and epiglottic deflection are activated, in most swallow attempt they do not quite reach maximal movement and the tail of the bolus resides in the pyriforms and vallecula post swallow. Many variables impact function; bolus size and weight in particular activates increased movement. With thin liquids, pt sometimes penetrates during the swallow as epigltotic defpection is often incomplete, but significant silent and sensed aspiration occurs after initial swallow and during subsequent swallow and pyriform residuals fall throught he interarytenoid space into the trachea. Best methods to  reduce aspiration were utilizing nectar thick liquids with a head turn which seems to improve UES opening and hyoid excursion. Second swallows and following bites with sips also needed. Also attempted chin tuck and breath hold without success. Discussed results with pt and lenght with visual feed back with video and images as well and demosntration and written instructions. MD also aware of findings. Will f/u for threapy intervention and training as needed. Pt will need f/u with SLP at SNF.   SLP Visit Diagnosis Dysphagia, oropharyngeal  phase (R13.12)  Impact on safety and function Severe aspiration risk  Swallow Evaluation Recommendations  SLP Diet Recommendations Dysphagia 3 (Mech soft) solids;Nectar thick liquid  Liquid Administration via Cup;Straw  Medication Administration Crushed with puree  Supervision Intermittent supervision to cue for compensatory strategies  Compensations Slow rate;Small sips/bites;Multiple dry swallows after each bite/sip;Follow solids with liquid (head turn left)  Postural Changes Remain semi-upright after after feeds/meals (Comment);Seated upright at 90 degrees  Treatment Plan  Oral Care Recommendations Other (Comment) (oral care three times a day)  Treatment Recommendations Therapy as outlined in treatment plan below  Follow up Recommendations Skilled Nursing facility  Speech Therapy Frequency (ACUTE ONLY) min 2x/week  Treatment Duration 2 weeks  Interventions Aspiration precaution training;Pharyngeal strengthening exercises;Compensatory techniques;Patient/family education;Trials of upgraded texture/liquids;Diet toleration management by SLP  Prognosis  Prognosis for Safe Diet Advancement Good  Individuals Consulted  Consulted and Agree with Results and Recommendations Patient  Progression Toward Goals  Progression toward goals Progressing toward goals  SLP Time Calculation  SLP Start Time (ACUTE ONLY) 1100  SLP Stop Time (ACUTE ONLY) 1126  SLP Time  Calculation (min) (ACUTE ONLY) 26 min  SLP Evaluations  $ SLP Speech Visit 1 Procedure  SLP Evaluations  $MBS Swallow 1 Procedure  $Swallowing Treatment 1 Procedure
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
## 2016-04-04 NOTE — [place] (Signed)
[place] MEDICA[place]2 LEVEL OF CARE SCREENING TOOL     IDENTIFICATION  Patient Name: Jake Chen Birthdate: Aug 13, 1950 Sex: male Admission Date (Current Location): 04/02/2016  [place]   [place]   Facility and Address:  [facility], [address], [place], [place] [address]      Provider Number: [id number]  Attending Physician Name and Address:  Cathren Harshipudeep K Rai, MD  Relative Name and Phone Number:       Current Level of Care: Hospital Recommended Level of Care: Skilled Nursing Facility Prior Approval Number:    Date Approved/Denied:   PASRR Number:    Discharge Plan: SNF    Current Diagnoses: Patient Active Problem List   Diagnosis Date Noted  . Protein-calorie malnutrition, severe 04/03/2016  . CAP (community acquired pneumonia) 04/03/2016  . DKA (diabetic ketoacidoses) (HCC) 04/02/2016  . Acute kidney injury (HCC) 04/02/2016  . Encephalopathy 04/02/2016  . Diarrhea 04/02/2016  . Unintentional weight loss 04/02/2016  . Acute metabolic encephalopathy   . Dysarthria   . CVA (cerebral infarction) 08/02/2014  . Uncontrolled diabetes mellitus without complication (HCC) 03/09/2014  . Anxiety and depression 07/19/2011  . Preventative health care 01/26/2011  . Depression 01/26/2011  . Erectile dysfunction 01/26/2011  . 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 WEIGHT LOSS 06/24/2007  . PSA, INCREASED 12/25/2006  . Hyperlipidemia 12/24/2006  . HYPERLIPIDEMIA 12/24/2006  . Anxiety state 12/24/2006  . DEPRESSION 12/24/2006  . Essential hypertension 12/24/2006    Orientation RESPIRATION BLADDER Height & Weight     Self, Time, Situation, Place  Normal Continent Weight: 119 lb 4.3 oz (54.1 kg) Height:  6' (182.9 cm)  BEHAVIORAL SYMPTOMS/MOOD NEUROLOGICAL BOWEL NUTRITION STATUS      Continent Diet (carb modified)  AMBULATORY STATUS COMMUNICATION OF NEEDS Skin   Limited Assist Verbally Other  (Comment) (knuckles have skin tears- severely dry)                       Personal Care Assistance Level of Assistance  Bathing, Dressing Bathing Assistance: Limited assistance   Dressing Assistance: Limited assistance     Functional Limitations Info             SPECIAL CARE FACTORS FREQUENCY  PT (By licensed PT), OT (By licensed OT), Speech therapy     PT Frequency: 5/wk OT Frequency: 5/wk     Speech Therapy Frequency: 2/wk      Contractures      Additional Factors Info  Code Status, Allergies, Insulin Sliding Scale, Isolation Precautions Code Status Info: FULL Allergies Info: NKA   Insulin Sliding Scale Info: 4/day Isolation Precautions Info: Enteric     Current Medications (04/04/2016):  This is the current hospital active medication list Current Facility-Administered Medications  Medication Dose Route Frequency Provider Last Rate Last Dose  . 0.9 %  sodium chloride infusion   Intravenous Continuous Ripudeep  LuoK Rai, MD 75 mL/hr at 04/04/16 0612    . aspirin tablet 325 mg  325 mg Oral Daily Gwenyth BenderKaren M Black, NP   325 mg at 04/04/16 [id number]  . atorvastatin (LIPITOR) tablet 40 mg  40 mg Oral q1800 Gwenyth BenderKaren M Black, NP   40 mg at 04/03/16 1705  . cetaphil (CETAPHIL) lotion   Topical BID Ozella Rocksavid J Merrell, MD      . feeding supplement (ENSURE ENLIVE) (ENSURE ENLIVE) liquid 237 mL  237 mL Oral TID BM Ripudeep  LuoK Rai, MD  237 mL at 04/03/16 2000  . heparin injection 5,000 Units  5,000 Units Subcutaneous Q8H Gwenyth Bender, NP   5,000 Units at 04/04/16 [phone]  . insulin aspart (novoLOG) injection 0-9 Units  0-9 Units Subcutaneous TID WC Leanne Chang, NP   2 Units at 04/03/16 1748  . insulin glargine (LANTUS) injection 10 Units  10 Units Subcutaneous QHS Leanne Chang, NP   10 Units at 04/03/16 2222  . levofloxacin (LEVAQUIN) IVPB 750 mg  750 mg Intravenous Q24H Ripudeep K Rai, MD   750 mg at 04/03/16 1705  . loperamide (IMODIUM) capsule 4 mg  4 mg Oral TID PRN Ripudeep Moiz Ryant Luo, MD   4 mg at 04/04/16 0414  . MEDLINE mouth rinse  15 mL Mouth Rinse BID Ozella Rocks, MD   15 mL at 04/04/16 0926  . metoprolol tartrate (LOPRESSOR) tablet 12.5 mg  12.5 mg Oral BID Gwenyth Bender, NP   12.5 mg at 04/04/16 9604  . mupirocin ointment (BACTROBAN) 2 %   Topical BID Ozella Rocks, MD         Discharge Medications: Please see discharge summary for a list of discharge medications.  Relevant Imaging Results:  Relevant Lab Results:   Additional Information SS#: [id number]  Burna Sis, LCSW

## 2016-04-04 NOTE — Progress Notes (Signed)
Patient down for Barium swallowing test.

## 2016-04-04 NOTE — Progress Notes (Signed)
Patient back from MRI and ready to eat his lunch.

## 2016-04-04 NOTE — Care Management Note (Signed)
Case Management Note  Patient Details  Name: Crissie FiguresRonald H Lear MRN: [id number] Date of Birth: August 04, 1950  Subjective/Objective:    DKA, AKI, Acute encephalopathy hx stroke               Action/Plan: please see previous NCM notes Discharge Planning: Chart reviewed. PT recommended SNF. Pt agreeable. CSW following for SNF placement.   PCP Corwin LevinsJOHN, JAMES W  MD   Expected Discharge Date:                Expected Discharge Plan:  Skilled Nursing Facility  In-House Referral:  Clinical Social Work  Discharge planning Services  CM Consult  Post Acute Care Choice:  NA Choice offered to:  NA  DME Arranged:  N/A DME Agency:  NA  HH Arranged:  NA HH Agency:  NA  Status of Service:  Completed, signed off  If discussed at Long Length of Stay Meetings, dates discussed:    Additional Comments:  Elliot CousinShavis, Maleko Greulich Ellen, RN 04/04/2016, 3:15 PM

## 2016-04-05 ENCOUNTER — Inpatient Hospital Stay (HOSPITAL_COMMUNITY): Payer: Medicare Other

## 2016-04-05 DIAGNOSIS — R079 Chest pain, unspecified: Secondary | ICD-10-CM

## 2016-04-05 LAB — BASIC METABOLIC PANEL
Anion gap: 8 (ref 5–15)
BUN: 13 mg/dL (ref 6–20)
CO2: 27 mmol/L (ref 22–32)
CREATININE: 0.65 mg/dL (ref 0.61–1.24)
Calcium: 8.1 mg/dL — ABNORMAL LOW (ref 8.9–10.3)
Chloride: 99 mmol/L — ABNORMAL LOW (ref 101–111)
GFR calc Af Amer: >60 mL/min (ref >60)
Glucose, Bld: 211 mg/dL — ABNORMAL HIGH (ref 65–99)
Potassium: 3.5 mmol/L (ref 3.5–5.1)
SODIUM: 134 mmol/L — AB (ref 135–145)

## 2016-04-05 LAB — GLUCOSE, CAPILLARY
GLUCOSE-CAPILLARY: 352 mg/dL — AB (ref 65–99)
Glucose-Capillary: 160 mg/dL — ABNORMAL HIGH (ref 65–99)
Glucose-Capillary: 168 mg/dL — ABNORMAL HIGH (ref 65–99)
Glucose-Capillary: 243 mg/dL — ABNORMAL HIGH (ref 65–99)
Glucose-Capillary: 293 mg/dL — ABNORMAL HIGH (ref 65–99)
Glucose-Capillary: 365 mg/dL — ABNORMAL HIGH (ref 65–99)

## 2016-04-05 LAB — CBC
HCT: 32.8 % — ABNORMAL LOW (ref 39.0–52.0)
Hemoglobin: 10.5 g/dL — ABNORMAL LOW (ref 13.0–17.0)
MCH: 27.6 pg (ref 26.0–34.0)
MCHC: 32 g/dL (ref 30.0–36.0)
MCV: 86.1 fL (ref 78.0–100.0)
PLATELETS: 268 10*3/uL (ref 150–400)
RBC: 3.81 MIL/uL — AB (ref 4.22–5.81)
RDW: 13.5 % (ref 11.5–15.5)
WBC: 6.8 10*3/uL (ref 4.0–10.5)

## 2016-04-05 LAB — ECHOCARDIOGRAM COMPLETE
Height: 72 in
WEIGHTICAEL: 1908.3 [oz_av]

## 2016-04-05 LAB — HEMOGLOBIN A1C: Mean Plasma Glucose: >398 mg/dL

## 2016-04-05 LAB — PROCALCITONIN: PROCALCITONIN: 1.09 ng/mL

## 2016-04-05 MED ORDER — LEVOFLOXACIN 750 MG PO TABS
750.0000 mg | ORAL_TABLET | Freq: Every day | ORAL | Status: DC
  Administered 2016-04-05: 750 mg via ORAL
  Filled 2016-04-05: qty 1

## 2016-04-05 NOTE — Progress Notes (Signed)
PHARMACIST - PHYSICIAN COMMUNICATION DR:   Rai CONCERNING: Antibiotic IV to Oral Route Change Policy  RECOMMENDATION: This patient is receiving Levaquin by the intravenous route.  Based on criteria approved by the Pharmacy and Therapeutics Committee, the antibiotic(s) is/are being converted to the equivalent oral doIsidoro Donningse form(s).   DESCRIPTION: These criteria include:  Patient being treated for a respiratory tract infection, urinary tract infection, cellulitis or clostridium difficile associated diarrhea if on metronidazole  The patient is not neutropenic and does not exhibit a GI malabsorption state  The patient is eating (either orally or via tube) and/or has been taking other orally administered medications for a least 24 hours  The patient is improving clinically and has a Tmax < 100.5  If you have questions about this conversion, please contact the Pharmacy Department  []   [phone]( [phone] )  [facility] []   [phone]( [phone] )  [facility] [x]   [phone]( [phone] )  [facility] []   [phone]( [phone] )  [facility] []   [phone]( [phone] )  [facility]    Georgina PillionElizabeth Leomia Blake, PharmD, BCPS Pager: [phone] 1:12 PM

## 2016-04-05 NOTE — Progress Notes (Signed)
  Echocardiogram 2D Echocardiogram has been performed.  Jake Chen, Jake Chen 04/05/2016, 11:52 AM
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
## 2016-04-05 NOTE — Progress Notes (Signed)
CSW provided bed offers to pt- he will review and make decision- informed of likely DC tomorrow  Burna SisJenna H. Ellory Khurana, LCSW Clinical Social Worker [phone]

## 2016-04-05 NOTE — Progress Notes (Signed)
qPhysical Therapy Treatment Patient Details Name: Jake Chen MRN: [id number] DOB: Jul 15, 1950 Today's Date: 04/05/2016    History of Present Illness Patient is a 66 y.o. male with medical history significant for diabetes, hypertension, hyperlipidemia, depression and CVA. Pt presented to the emergency department with chief complaint of persistent diarrhea and unintentional weight loss. Initial evaluation in the emergency department reveals acute encephalopathy versus dementia in the setting of diabetic ketoacidosis, tachycardia, acute kidney injury.. CXR shows Left lower lobe atelectasis or pneumonia      PT Comments    Pt continues to be very unsteady with ambulation. Pt again with LOB x2 with gait, requiring mod A to prevent fall. All VSS throughout. PT continuing to recommend pt d/c to SNF for further therapy services. Pt would continue to benefit from skilled physical therapy services at this time while admitted and after d/c to address the below listed limitations in order to improve overall safety and independence with functional mobility.    Follow Up Recommendations  SNF;Supervision/Assistance - 24 hour     Equipment Recommendations  Rolling walker with 5" wheels    Recommendations for Other Services       Precautions / Restrictions Precautions Precautions: Fall Restrictions Weight Bearing Restrictions: No    Mobility  Bed Mobility               General bed mobility comments: pt sitting OOB in recliner chair when therapist entered room  Transfers Overall transfer level: Needs assistance Equipment used: Rolling walker (2 wheeled) Transfers: Sit to/from Stand Sit to Stand: Min assist         General transfer comment: min A for stability, VC'ing for bilateral hand placement  Ambulation/Gait Ambulation/Gait assistance: Min assist;Mod assist Ambulation Distance (Feet): 40 Feet Assistive device: Rolling walker (2 wheeled) Gait Pattern/deviations:  Step-through pattern;Decreased step length - right;Decreased step length - left;Decreased stride length;Scissoring;Narrow base of support;Ataxic Gait velocity: decreased Gait velocity interpretation: Below normal speed for age/gender General Gait Details: very unsteady with use of RW, constant min A for stability and pt with LOB x2 that required mod A to maintain upright standing position   Stairs            Wheelchair Mobility    Modified Rankin (Stroke Patients Only)       Balance Overall balance assessment: Needs assistance Sitting-balance support: Feet supported Sitting balance-Leahy Scale: Fair     Standing balance support: During functional activity;Bilateral upper extremity supported Standing balance-Leahy Scale: Poor Standing balance comment: patient with increased sway and poor ability to maintain static balance                            Cognition Arousal/Alertness: Awake/alert Behavior During Therapy: WFL for tasks assessed/performed Overall Cognitive Status: No family/caregiver present to determine baseline cognitive functioning Area of Impairment: Memory;Safety/judgement                     Memory: Decreased short-term memory   Safety/Judgement: Decreased awareness of safety;Decreased awareness of deficits            Exercises General Exercises - Lower Extremity Long Arc Quad: AROM;Strengthening;Both;10 reps;Seated Hip Flexion/Marching: AROM;Strengthening;Both;10 reps;Seated Mini-Sqauts: AROM;Strengthening;Both;10 reps;Standing    General Comments        Pertinent Vitals/Pain Pain Assessment: No/denies pain    Home Living                      Prior  Function            PT Goals (current goals can now be found in the care plan section) Acute Rehab PT Goals PT Goal Formulation: With patient Time For Goal Achievement: 04/17/16 Potential to Achieve Goals: Fair Progress towards PT goals: Progressing toward  goals    Frequency    Min 3X/week      PT Plan Current plan remains appropriate    Co-evaluation             End of Session Equipment Utilized During Treatment: Gait belt Activity Tolerance: Patient tolerated treatment well Patient left: in chair;with call bell/phone within reach Nurse Communication: Mobility status PT Visit Diagnosis: Unsteadiness on feet (R26.81);Ataxic gait (R26.0);Muscle weakness (generalized) (M62.81)     Time: [id number] PT Time Calculation (min) (ACUTE ONLY): 20 min  Charges:  $Gait Training: 8-22 mins                    G Codes:       [place]ennifer Anvay Tenn[place], [place]PT, [place] [phone]    Alessandra BevelsJennifer M Khrystyna Schwalm 04/05/2016, 4:55 PM

## 2016-04-05 NOTE — Plan of Care (Signed)
Problem: Skin Integrity: Goal: Risk for impaired skin integrity will decrease Outcome: Progressing Patient requested bath on night shift. Skin was clean, no evidence of new skin breakdown. Bactroban and Cetaphil lotion reapplied to cracking on hands, right big toe, and left heel.

## 2016-04-05 NOTE — Progress Notes (Signed)
Removed patient from enteric precautions per infection prevention RN.

## 2016-04-05 NOTE — Progress Notes (Signed)
  Speech Language Pathology Treatment: Dysphagia  Patient Details Name: Jake Chen MRN: [id number] DOB: [birth year] Today's Date: 04/05/2016 Time: 0920-0950 SLP Time Calculation (min) (ACUTE ONLY): 30 min  Assessment / Plan / Recommendation Clinical Impression  Pt seen during am meal. SLP provided visual cues posted on wall at beginning of session to reinforce strategies, though pt also stated them independently. Pt needed moderate verbal cues to time head turn and second swallow effectively (he was turing head back forwards before swallowing). Pt carried this over throughout meal with min verbal cues and about 80% accuracy. Also followed solids with liquids every 1-2 bites. Throat clearing and delayed coughing still intermittently observed. Initiated chin tuck against resistance exercise, which pt completed 10x over 3 sets. Provided written instruction for pt to complete independently 3 x a day. Pt will be at persistent risk of aspiration given deficits, modified diet and strategies hopefully will mitigate risk, as well as oral care habits.   Despite relatively good mentation, pts continues to demonstrate ataxic dysarthria, dysphagia and poor balance and limb control with sitting and repositioning. Noted that MRI is negative. Likely pt would benefit from f/u with neurology given concern for possible neuromuscular impairment.    HPI HPI: Jake FiguresRonald H Olkowski is a 66 y.o. male with medical history significant for diabetes, hypertension, hyperlipidemia, depression, CVA since emergency department with chief complaint of persistent diarrhea and unintentional weight loss. Initial evaluation in the emergency department reveals acute encephalopathy versus dementia in the setting of diabetic ketoacidosis, tachycardia, acute kidney injury.. CXR shows Left lower lobe atelectasis or pneumonia       SLP Plan  Continue with current plan of care       Recommendations  Diet recommendations: Dysphagia 3  (mechanical soft);Nectar-thick liquid Medication Administration: Whole meds with liquid Supervision: Intermittent supervision to cue for compensatory strategies Compensations: Minimize environmental distractions;Clear throat intermittently;Multiple dry swallows after each bite/sip;Follow solids with liquid;Effortful swallow                Oral Care Recommendations: Oral care BID Follow up Recommendations: Skilled Nursing facility SLP Visit Diagnosis: Dysphagia, oropharyngeal phase (R13.12) Plan: Continue with current plan of care       GO               [facility] Treasure CoastBonnie Zaidyn Claire, MA CCC-SLP [phone]   Claudine MoutonDeBlois, Kaisyn Reinhold Caroline 04/05/2016, 10:18 AM

## 2016-04-06 DIAGNOSIS — I69322 Dysarthria following cerebral infarction: Secondary | ICD-10-CM | POA: Diagnosis not present

## 2016-04-06 DIAGNOSIS — R2681 Unsteadiness on feet: Secondary | ICD-10-CM | POA: Diagnosis not present

## 2016-04-06 DIAGNOSIS — J181 Lobar pneumonia, unspecified organism: Secondary | ICD-10-CM | POA: Diagnosis not present

## 2016-04-06 DIAGNOSIS — R278 Other lack of coordination: Secondary | ICD-10-CM | POA: Diagnosis not present

## 2016-04-06 DIAGNOSIS — R300 Dysuria: Secondary | ICD-10-CM | POA: Diagnosis not present

## 2016-04-06 DIAGNOSIS — R197 Diarrhea, unspecified: Secondary | ICD-10-CM | POA: Diagnosis not present

## 2016-04-06 DIAGNOSIS — M6281 Muscle weakness (generalized): Secondary | ICD-10-CM | POA: Diagnosis not present

## 2016-04-06 DIAGNOSIS — Z7982 Long term (current) use of aspirin: Secondary | ICD-10-CM | POA: Diagnosis not present

## 2016-04-06 DIAGNOSIS — E1110 Type 2 diabetes mellitus with ketoacidosis without coma: Secondary | ICD-10-CM | POA: Diagnosis not present

## 2016-04-06 DIAGNOSIS — F418 Other specified anxiety disorders: Secondary | ICD-10-CM | POA: Diagnosis not present

## 2016-04-06 DIAGNOSIS — E0810 Diabetes mellitus due to underlying condition with ketoacidosis without coma: Secondary | ICD-10-CM | POA: Diagnosis not present

## 2016-04-06 DIAGNOSIS — R634 Abnormal weight loss: Secondary | ICD-10-CM | POA: Diagnosis not present

## 2016-04-06 DIAGNOSIS — I69398 Other sequelae of cerebral infarction: Secondary | ICD-10-CM | POA: Diagnosis not present

## 2016-04-06 DIAGNOSIS — Z794 Long term (current) use of insulin: Secondary | ICD-10-CM | POA: Diagnosis not present

## 2016-04-06 DIAGNOSIS — F4323 Adjustment disorder with mixed anxiety and depressed mood: Secondary | ICD-10-CM | POA: Diagnosis not present

## 2016-04-06 DIAGNOSIS — A09 Infectious gastroenteritis and colitis, unspecified: Secondary | ICD-10-CM | POA: Diagnosis not present

## 2016-04-06 DIAGNOSIS — N179 Acute kidney failure, unspecified: Secondary | ICD-10-CM | POA: Diagnosis not present

## 2016-04-06 DIAGNOSIS — E860 Dehydration: Secondary | ICD-10-CM

## 2016-04-06 DIAGNOSIS — E119 Type 2 diabetes mellitus without complications: Secondary | ICD-10-CM | POA: Diagnosis not present

## 2016-04-06 DIAGNOSIS — R1312 Dysphagia, oropharyngeal phase: Secondary | ICD-10-CM | POA: Diagnosis not present

## 2016-04-06 DIAGNOSIS — F411 Generalized anxiety disorder: Secondary | ICD-10-CM | POA: Diagnosis not present

## 2016-04-06 DIAGNOSIS — I10 Essential (primary) hypertension: Secondary | ICD-10-CM | POA: Diagnosis not present

## 2016-04-06 DIAGNOSIS — R1030 Lower abdominal pain, unspecified: Secondary | ICD-10-CM | POA: Diagnosis not present

## 2016-04-06 DIAGNOSIS — J189 Pneumonia, unspecified organism: Secondary | ICD-10-CM | POA: Diagnosis not present

## 2016-04-06 DIAGNOSIS — G9341 Metabolic encephalopathy: Secondary | ICD-10-CM | POA: Diagnosis not present

## 2016-04-06 DIAGNOSIS — R1084 Generalized abdominal pain: Secondary | ICD-10-CM | POA: Diagnosis not present

## 2016-04-06 DIAGNOSIS — H6123 Impacted cerumen, bilateral: Secondary | ICD-10-CM | POA: Diagnosis not present

## 2016-04-06 DIAGNOSIS — E089 Diabetes mellitus due to underlying condition without complications: Secondary | ICD-10-CM | POA: Diagnosis not present

## 2016-04-06 DIAGNOSIS — E785 Hyperlipidemia, unspecified: Secondary | ICD-10-CM | POA: Diagnosis not present

## 2016-04-06 DIAGNOSIS — E1165 Type 2 diabetes mellitus with hyperglycemia: Secondary | ICD-10-CM | POA: Diagnosis not present

## 2016-04-06 DIAGNOSIS — R339 Retention of urine, unspecified: Secondary | ICD-10-CM | POA: Diagnosis not present

## 2016-04-06 DIAGNOSIS — E43 Unspecified severe protein-calorie malnutrition: Secondary | ICD-10-CM | POA: Diagnosis not present

## 2016-04-06 DIAGNOSIS — E0865 Diabetes mellitus due to underlying condition with hyperglycemia: Secondary | ICD-10-CM | POA: Diagnosis not present

## 2016-04-06 DIAGNOSIS — E1310 Other specified diabetes mellitus with ketoacidosis without coma: Secondary | ICD-10-CM

## 2016-04-06 DIAGNOSIS — F0390 Unspecified dementia without behavioral disturbance: Secondary | ICD-10-CM | POA: Diagnosis not present

## 2016-04-06 DIAGNOSIS — K5900 Constipation, unspecified: Secondary | ICD-10-CM | POA: Diagnosis not present

## 2016-04-06 DIAGNOSIS — R110 Nausea: Secondary | ICD-10-CM | POA: Diagnosis not present

## 2016-04-06 DIAGNOSIS — R972 Elevated prostate specific antigen [PSA]: Secondary | ICD-10-CM | POA: Diagnosis not present

## 2016-04-06 LAB — BASIC METABOLIC PANEL
Anion gap: 6 (ref 5–15)
BUN: 15 mg/dL (ref 6–20)
CALCIUM: 8.4 mg/dL — AB (ref 8.9–10.3)
CO2: 32 mmol/L (ref 22–32)
CREATININE: 0.67 mg/dL (ref 0.61–1.24)
Chloride: 99 mmol/L — ABNORMAL LOW (ref 101–111)
GFR calc Af Amer: >60 mL/min (ref >60)
GLUCOSE: 296 mg/dL — AB (ref 65–99)
Potassium: 3.9 mmol/L (ref 3.5–5.1)
SODIUM: 137 mmol/L (ref 135–145)

## 2016-04-06 LAB — GLUCOSE, CAPILLARY
Glucose-Capillary: 120 mg/dL — ABNORMAL HIGH (ref 65–99)
Glucose-Capillary: 406 mg/dL — ABNORMAL HIGH (ref 65–99)

## 2016-04-06 LAB — CBC
HCT: 34.1 % — ABNORMAL LOW (ref 39.0–52.0)
Hemoglobin: 11.2 g/dL — ABNORMAL LOW (ref 13.0–17.0)
MCH: 28.5 pg (ref 26.0–34.0)
MCHC: 32.8 g/dL (ref 30.0–36.0)
MCV: 86.8 fL (ref 78.0–100.0)
Platelets: 282 10*3/uL (ref 150–400)
RBC: 3.93 MIL/uL — ABNORMAL LOW (ref 4.22–5.81)
RDW: 13.2 % (ref 11.5–15.5)
WBC: 6.7 10*3/uL (ref 4.0–10.5)

## 2016-04-06 MED ORDER — LEVOFLOXACIN 750 MG PO TABS: 750.0000 mg | ORAL_TABLET | Freq: Every day | ORAL | Status: DC

## 2016-04-06 MED ORDER — ENSURE ENLIVE PO LIQD: 237.0000 mL | Freq: Three times a day (TID) | ORAL | 12 refills | Status: DC

## 2016-04-06 MED ORDER — METOPROLOL TARTRATE 25 MG PO TABS: 25.0000 mg | ORAL_TABLET | Freq: Two times a day (BID) | ORAL | Status: DC

## 2016-04-06 MED ORDER — CETAPHIL MOISTURIZING EX LOTN: TOPICAL_LOTION | Freq: Two times a day (BID) | CUTANEOUS | 0 refills | Status: AC

## 2016-04-06 MED ORDER — RESOURCE THICKENUP CLEAR PO POWD: ORAL | Status: DC

## 2016-04-06 MED ORDER — ATORVASTATIN CALCIUM 40 MG PO TABS: 40.0000 mg | ORAL_TABLET | Freq: Every day | ORAL | 0 refills | Status: DC

## 2016-04-06 MED ORDER — ASPIRIN 325 MG PO TABS: 325.0000 mg | ORAL_TABLET | Freq: Every day | ORAL | 0 refills | Status: AC

## 2016-04-06 MED ORDER — METOPROLOL TARTRATE 25 MG PO TABS
25.0000 mg | ORAL_TABLET | Freq: Two times a day (BID) | ORAL | Status: DC
  Administered 2016-04-06: 25 mg via ORAL
  Filled 2016-04-06: qty 1

## 2016-04-06 MED ORDER — INSULIN ASPART 100 UNIT/ML ~~LOC~~ SOLN
4.0000 [IU] | Freq: Three times a day (TID) | SUBCUTANEOUS | Status: DC
  Administered 2016-04-06: 4 [IU] via SUBCUTANEOUS

## 2016-04-06 MED ORDER — INSULIN ASPART 100 UNIT/ML ~~LOC~~ SOLN: 0.0000 [IU] | Freq: Three times a day (TID) | SUBCUTANEOUS | 11 refills | Status: DC

## 2016-04-06 MED ORDER — INSULIN GLARGINE 100 UNIT/ML ~~LOC~~ SOLN
12.0000 [IU] | Freq: Every day | SUBCUTANEOUS | Status: DC
  Filled 2016-04-06: qty 0.12

## 2016-04-06 MED ORDER — INSULIN GLARGINE 100 UNIT/ML ~~LOC~~ SOLN: 12.0000 [IU] | Freq: Every day | SUBCUTANEOUS | 11 refills | Status: DC

## 2016-04-06 MED ORDER — INSULIN ASPART 100 UNIT/ML ~~LOC~~ SOLN: 4.0000 [IU] | Freq: Three times a day (TID) | SUBCUTANEOUS | 11 refills | Status: DC

## 2016-04-06 MED ORDER — LOPERAMIDE HCL 2 MG PO CAPS: 2.0000 mg | ORAL_CAPSULE | Freq: Three times a day (TID) | ORAL | 0 refills | Status: AC | PRN

## 2016-04-06 NOTE — Care Management Important Message (Signed)
Important Message  Patient Details  Name: Jake Chen MRN: [id number] Date of Birth: 1950-02-14   Medicare Important Message Given:  Yes    Kyla Balzarine 04/06/2016, 10:52 AM

## 2016-04-06 NOTE — Plan of Care (Signed)
Problem: Health Behavior/Discharge Planning: Goal: Ability to manage health-related needs will improve Outcome: Progressing Patient asking questions about when he goes to rehab. Explained to him that he would have speech and physical therapists working with him there. Further discussed that he was making a decision and would plan to discuss this with social worker, [place], 3.30.18.  Problem: Skin Integrity: Goal: Risk for impaired skin integrity will decrease Outcome: Progressing Patient states he feels like his hands are improving. Frequent reminders/ teaching about how to care for his hand wounds when he leaves the hospital. Patient was using hydrogen peroxide on the wounds- explained to him how damaging that is to the skin. Explained he would be better suited using antibiotic ointment and a good hand cream until healed and in the future.  Problem: Activity: Goal: Risk for activity intolerance will decrease Outcome: Progressing Patient got up to chair for several hours on day shift. Patient placed back into bed during night shift rounds.

## 2016-04-06 NOTE — Discharge Summary (Signed)
Physician Discharge Summary   Patient ID: Jake Chen MRN: [id number] DOB/AGE: Feb 22, 1950 66 y.o.  Admit date: 04/02/2016 Discharge date: 04/06/2016  Primary Care Physician:  [name], MD  Discharge Diagnoses:    . DKA (diabetic ketoacidoses) (HCC) . Anxiety state . Essential hypertension . Acute kidney injury (HCC) . Uncontrolled diabetes mellitus without complication (HCC) . acute Encephalopathy . Diarrhea . Unintentional weight loss . ADJ DISORDER WITH MIXED ANXIETY & DEPRESSED MOOD . CAP (community acquired pneumonia)   Consults: Psychiatry  Recommendations for Outpatient Follow-up:  1. Please repeat CBC/BMET at next visit 2. Please follow hemoglobin A1c in 8-10 weeks   DIET: Carb modified diet, dysphagia 3, nectar thick liquids    Allergies:  No Known Allergies   DISCHARGE MEDICATIONS: Current Discharge Medication List    START taking these medications   Details  cetaphil (CETAPHIL) lotion Apply topically 2 (two) times daily. Apply to hands<br>Substitute Cetaphil Lotion for Lubriderm lotion Qty: 236 mL, Refills: 0    feeding supplement, ENSURE ENLIVE, (ENSURE ENLIVE) LIQD Take 237 mLs by mouth 3 (three) times daily between meals. Qty: 237 mL, Refills: 12    !! insulin aspart (NOVOLOG) 100 UNIT/ML injection Inject 0-9 Units into the skin 3 (three) times daily with meals. Sliding scale CBG 70 - 120: 0 units CBG 121 - 150: 1 unit,  CBG 151 - 200: 2 units,  CBG 201 - 250: 3 units,  CBG 251 - 300: 5 units,  CBG 301 - 350: 7 units,  CBG 351 - 400: 9 units   CBG > 400: 9 units and notify your MD Qty: 10 mL, Refills: 11    !! insulin aspart (NOVOLOG) 100 UNIT/ML injection Inject 4 Units into the skin 3 (three) times daily with meals. Qty: 10 mL, Refills: 11    insulin glargine (LANTUS) 100 UNIT/ML injection Inject 0.12 mLs (12 Units total) into the skin at bedtime. Qty: 10 mL, Refills: 11    levofloxacin (LEVAQUIN) 750 MG tablet Take 1 tablet (750 mg total)  by mouth daily. X 3 more days    loperamide (IMODIUM) 2 MG capsule Take 1 capsule (2 mg total) by mouth 3 (three) times daily as needed for diarrhea or loose stools. Qty: 30 capsule, Refills: 0    Maltodextrin-Xanthan Gum (RESOURCE THICKENUP CLEAR) POWD Nectar thick consistency    metoprolol tartrate (LOPRESSOR) 25 MG tablet Take 1 tablet (25 mg total) by mouth 2 (two) times daily.     !! - Potential duplicate medications found. Please discuss with provider.    CONTINUE these medications which have CHANGED   Details  aspirin 325 MG tablet Take 1 tablet (325 mg total) by mouth daily. Qty: 30 tablet, Refills: 0    atorvastatin (LIPITOR) 40 MG tablet Take 1 tablet (40 mg total) by mouth at bedtime. Must make yearly appt w/labs for future refills Qty: 30 tablet, Refills: 0         Brief H and P: For complete details please refer to admission H and P, but in brief 66 y.o.malewith medical history significant for diabetes, hypertension, hyperlipidemia, depression, CVA Presented to ED with persistent diarrhea and unintentional weight loss. Patient however has dementia and poor historian, reported that he has diarrhea for approximately 2 weeks. He states 2 days ago he was unable to tolerate food or liquid. He also reports unintentional weight loss. He does not manage his diabetes or monitor it in any form or fashion. He states he believes his speech is More slurred  but he attributes that to having a "very dry mouth". He also reported increased fatigue. In the emergency department is afebrile hemodynamically stable tachycardia mild tachypnea, mild leukocytosis and glucose of 397 and an ion gap of 22. He is provided with 2 L of normal saline and insulin drip is initiated  Hospital Course:   DKA (diabetic ketoacidoses) (HCC) - Likely related to noncompliance in the setting of dementia. Home medications include Lantus and oral agents. Serum glucose 391 on admission, anion gap 22 - Patient was  placed on IV fluids and insulin drip. Once the anion gap was closed, patient was started on Lantus and sliding scale insulin - Diabetic coordinator and nutrition consult was obtained. Patient is not discharged on Lantus 12 units at bedtime, NovoLog 40 units with meals and sliding scale insulin. Please adjust insulin regimen according to the patient's blood sugars. Please follow hemoglobin A1c in 8-10 weeks. Hemoglobin A1c was over 15.5 during this admission.   Acute kidney injury. Creatinine 1.55 on admission likely due to dehydration and DKA - Creatinine is improving, 0.6   Acute encephalopathy related to metabolic derangement vs dementia or left lower lobe pneumonia No focal defecits. No UTI. Chest x-ray showed left lower lobe atelectasis or pneumonia, recommended two-view chest x-ray. - TSH normal, RPR nonreactive - Mental status improved - CT abd showed b/l lower air space disease consistent with PNA, patient was started on IV levaquin  - patient had aspiration/dysphagia, on PT evaluation, gait instability, MRI brain was done and was negative for CVA - Patient has been transitioned to oral Levaquin  Chronic Diarrhea. Patient reports persistent watery diarrhea over 2 weeks with weight loss, no abdominal pain.  - CT abd showed no colitis or acute abdominal pathology, however showed bilateral air space disease, hence he was placed on IV Levaquin. - c diff negative, continue loperamide as needed  - Per patient, diarrhea has significantly improved  Hypertension. -BP now stable, continue low-dose metoprolol   Diabetes mellitus, uncontrolled with DKA on admission  - See #1. Home regimen includes oral agents and Lantus. Chart review indicates patient has refused any injectable meds in the past. -  hemoglobin A1c > 15.5.  History of stroke. Had some residual dysarthria and right sided weakness -continue asa and statin - PT eval recommended skilled nursing facility  Elevated troponins  secondary to demand ischemia, DKA - Currently no complaints of any chest pain or shortness of breath, troponins improving,  - 2-D echo showed EF of 55-60% with grade 2 diastolic dysfunction, no regional wall motion abnormalities - Continue aspirin, beta blocker, statin   Day of Discharge BP (!) 146/101 (BP Location: Right Arm)   Pulse 92   Temp 97.7 F (36.5 C) (Oral)   Resp 19   Ht 6' (1.829 m)   Wt 54.1 kg (119 lb 4.3 oz)   SpO2 96%   BMI 16.18 kg/m   Physical Exam: General: Alert and awake oriented x3 not in any acute distress. HEENT: anicteric sclera, pupils reactive to light and accommodation CVS: S1-S2 clear no murmur rubs or gallops Chest: clear to auscultation bilaterally, no wheezing rales or rhonchi Abdomen: soft nontender, nondistended, normal bowel sounds Extremities: no cyanosis, clubbing or edema noted bilaterally Neuro: Cranial nerves II-XII intact, no focal neurological deficits   The results of significant diagnostics from this hospitalization (including imaging, microbiology, ancillary and laboratory) are listed below for reference.    LAB RESULTS: Basic Metabolic Panel:  Recent Labs Lab 04/05/16 0239 04/06/16 0235  NA 134* 137  K 3.5 3.9  CL 99* 99*  CO2 27 32  GLUCOSE 211* 296*  BUN 13 15  CREATININE 0.65 0.67  CALCIUM 8.1* 8.4*   Liver Function Tests:  Recent Labs Lab 04/02/16 1219  AST 15  ALT 17  ALKPHOS 103  BILITOT 2.1*  PROT 7.4  ALBUMIN 2.7*   No results for input(s): LIPASE, AMYLASE in the last 168 hours. No results for input(s): AMMONIA in the last 168 hours. CBC:  Recent Labs Lab 04/02/16 1219  04/05/16 0239 04/06/16 0235  WBC 13.0*  < > 6.8 6.7  NEUTROABS 12.0*  --   --   --   HGB 13.2  < > 10.5* 11.2*  HCT 39.7  < > 32.8* 34.1*  MCV 87.8  < > 86.1 86.8  PLT 327  < > 268 282  < > = values in this interval not displayed. Cardiac Enzymes:  Recent Labs Lab 04/03/16 1232 04/03/16 1822  TROPONINI 0.05* <0.03    BNP: Invalid input(s): POCBNP CBG:  Recent Labs Lab 04/05/16 2105 04/06/16 0733  GLUCAP 352* 120*    Significant Diagnostic Studies:  Dg Chest 2 View  Result Date: 04/03/2016 CLINICAL DATA:  Follow-up pneumonia. EXAM: CHEST  2 VIEW COMPARISON:  Portable chest x-ray of April 02, 2016 FINDINGS: The lungs remain hyperinflated. The interstitial markings in the lower lobes remain increased and are slightly more conspicuous. There is no pleural effusion or pneumothorax. The heart and pulmonary vascularity are normal. The mediastinum is normal in width. The bony thorax exhibits no acute abnormality. IMPRESSION: Persistent bilateral lower lobe pneumonia. Slight interval deterioration since yesterday's study. Electronically Signed   By: David  [place] M.D.   On: 04/03/2016 14:49   Ct Abdomen Pelvis W Contrast  Result Date: 04/03/2016 CLINICAL DATA:  Diarrhea for over 2 weeks. EXAM: CT ABDOMEN AND PELVIS WITH CONTRAST TECHNIQUE: Multidetector CT imaging of the abdomen and pelvis was performed using the standard protocol following bolus administration of intravenous contrast. CONTRAST:  100 ml ISOVUE-300 IOPAMIDOL (ISOVUE-300) INJECTION 61% COMPARISON:  Single-view of the chest 04/02/2016. PA and lateral chest 01/22/2015. FINDINGS: Lower chest: There is no pleural or pericardial effusion. Bilateral lower lobe airspace disease has an appearance most worrisome for bronchopneumonia. Hepatobiliary: No focal liver abnormality is seen. No gallstones, gallbladder wall thickening, or biliary dilatation. Pancreas: Unremarkable. No pancreatic ductal dilatation or surrounding inflammatory changes. Spleen: Normal in size without focal abnormality. Adrenals/Urinary Tract: Adrenal glands are unremarkable. Kidneys are normal, without renal calculi, focal lesion, or hydronephrosis. Bladder is unremarkable. Stomach/Bowel: Stomach is within normal limits. Appendix appears normal. No evidence of bowel wall thickening,  distention, or inflammatory changes. Vascular/Lymphatic: A few scattered aortoiliac atherosclerotic calcifications without aneurysm are identified. Reproductive: Prostate gland is prominent. Other: There is diffuse body wall and mesenteric edema. No focal fluid collection is identified. The patient appears cachectic. Musculoskeletal: No acute or focal bony abnormality is identified. IMPRESSION: Bilateral lower lobe airspace disease with an appearance most worrisome for pneumonia. Negative for colitis. Body wall and mesenteric edema most consistent with anasarca. Atherosclerosis. Electronically Signed   By: Drusilla Kanner M.D.   On: 04/03/2016 14:38   Dg Chest Port 1 View  Result Date: 04/02/2016 CLINICAL DATA:  Fatigue, weakness, and diarrhea for the past 2 weeks with weight loss over the past several months. The patient is experiencing elevated blood pressure, tachycardia, and low oxygen saturation today. EXAM: PORTABLE CHEST 1 VIEW COMPARISON:  Chest x-ray of January 22, 2015 FINDINGS:  The lungs are hyperinflated. There is infiltrate in the left lower lobe posteriorly. There is no pleural effusion or pneumothorax. The heart and pulmonary vascularity are normal. The mediastinum is normal in width. The bony thorax exhibits no acute fracture. Left lower lateral rib fractures appear to have healed. IMPRESSION: Left lower lobe atelectasis or pneumonia superimposed upon reactive airway disease or COPD. A PA and lateral chest x-ray would be useful. Electronically Signed   By: David  [place] M.D.   On: 04/02/2016 15:48    2D ECHO: Study Conclusions  - Left ventricle: The cavity size was normal. Wall thickness was   increased in a pattern of mild LVH. Systolic function was normal.   The estimated ejection fraction was in the range of 55% to 60%.   Wall motion was normal; there were no regional wall motion   abnormalities. Features are consistent with a pseudonormal left   ventricular filling pattern, with  concomitant abnormal relaxation   and increased filling pressure (grade 2 diastolic dysfunction). - Mitral valve: There was mild regurgitation. - Right atrium: The atrium was mildly dilated. - Pulmonary arteries: Systolic pressure was mildly increased. PA   peak pressure: 35 mm Hg (S). - Pericardium, extracardiac: A trivial pericardial effusion was   identified.  Disposition and Follow-up: Discharge Instructions    Discharge instructions    Complete by:  As directed    Discharge Diet: Dysphagia 3, nectar thick   Increase activity slowly    Complete by:  As directed        DISPOSITION: SNF    DISCHARGE FOLLOW-UP  Contact information for follow-up providers    [name], MD. Schedule an appointment as soon as possible for a visit in 2 week(s).   Specialties:  Internal Medicine, Radiology Contact information: [address] Maggie Schwalbe [facility] [place] [address] [phone]            Contact information for after-discharge care    Destination    [facility] Follow up.   Specialty:  Skilled Nursing Facility Contact information: [address] [place] [address] [phone]                   Time spent on Discharge: 33 mins   Signed:   Anwar Crill M.D. [facility] 04/06/2016, 10:33 AM Pager: [phone]

## 2016-04-06 NOTE — Progress Notes (Signed)
Received diabetes coordinator consult.  Spoke with patient in the room this am. Patient states that he is waiting to be discharged to [facility]. Patient states that he has had diabetes for about 10 years. When asked if he had been on insulin at home, he started looking at papers on his bed and never really answered the question. Rehab will have to determine his mental status as he progresses with recovery.   Smith Mince RN BSN CDE Diabetes Coordinator Pager: [phone]

## 2016-04-06 NOTE — Progress Notes (Signed)
Patient provided discharge education and summary, and verbalized understanding. Patient belongings: coat, clothing, cell phone, glasses were given to patient.  Patient refused EMS transport; RN contacted [employer] taxi to transport patient to [facility] per patient request. Patient taken to taxi and assisted into vehicle.

## 2016-04-06 NOTE — Progress Notes (Signed)
Patient will discharge to [facility] Anticipated discharge date: 3/30 Family notified: pt dtr [facility] by taxi- pt refusing ambulance service and would like to take taxi- taxi company [employer] # [phone] Facility to look for pt and bring wheelchair to taxi when pt arrives Report #: [phone]  CSW signing off.  Burna Sis, LCSW Clinical Social Worker [phone]

## 2016-04-09 LAB — BASIC METABOLIC PANEL
BUN: 13 mg/dL (ref 4–21)
CREATININE: 0.5 mg/dL — AB (ref 0.6–1.3)
Glucose: 287 mg/dL
Potassium: 4.1 mmol/L (ref 3.4–5.3)
Sodium: 137 mmol/L (ref 137–147)

## 2016-04-09 LAB — CBC AND DIFFERENTIAL
HEMATOCRIT: 33 % — AB (ref 41–53)
Hemoglobin: 10.4 g/dL — AB (ref 13.5–17.5)
PLATELETS: 340 10*3/uL (ref 150–399)
WBC: 7.8 10*3/mL

## 2016-04-10 ENCOUNTER — Non-Acute Institutional Stay (SKILLED_NURSING_FACILITY): Payer: Medicare Other | Admitting: Internal Medicine

## 2016-04-10 ENCOUNTER — Encounter: Payer: Self-pay | Admitting: Internal Medicine

## 2016-04-10 DIAGNOSIS — R1312 Dysphagia, oropharyngeal phase: Secondary | ICD-10-CM

## 2016-04-10 DIAGNOSIS — G9341 Metabolic encephalopathy: Secondary | ICD-10-CM | POA: Diagnosis not present

## 2016-04-10 DIAGNOSIS — R1310 Dysphagia, unspecified: Secondary | ICD-10-CM | POA: Insufficient documentation

## 2016-04-10 DIAGNOSIS — E0865 Diabetes mellitus due to underlying condition with hyperglycemia: Secondary | ICD-10-CM | POA: Diagnosis not present

## 2016-04-10 DIAGNOSIS — IMO0001 Reserved for inherently not codable concepts without codable children: Secondary | ICD-10-CM

## 2016-04-10 DIAGNOSIS — E0810 Diabetes mellitus due to underlying condition with ketoacidosis without coma: Secondary | ICD-10-CM | POA: Diagnosis not present

## 2016-04-10 DIAGNOSIS — J181 Lobar pneumonia, unspecified organism: Secondary | ICD-10-CM | POA: Diagnosis not present

## 2016-04-10 DIAGNOSIS — J189 Pneumonia, unspecified organism: Secondary | ICD-10-CM

## 2016-04-10 DIAGNOSIS — E089 Diabetes mellitus due to underlying condition without complications: Secondary | ICD-10-CM

## 2016-04-10 NOTE — Progress Notes (Signed)
This is a comprehensive admission note to [facility] performed on this date less than 30 days from date of admission. Included are preadmission medical/surgical history;reconciled medication list; family history; social history and comprehensive review of systems.  Corrections and additions to the records were documented . Comprehensive physical exam was also performed. Additionally a clinical summary was entered for each active diagnosis pertinent to this admission in the Problem List to enhance continuity of care.  PCP: [name] MD  HPI: Patient was hospitalized 3/26-3/30/18 with diabetic ketoacidosis with severely uncontrolled diabetes. A1c was > 15.5%.Epic indicates he has been diagnosed with diabetes since 2008. The patient presented with frankly watery diarrhea and unintentional weight loss . Also the patient was felt to have dementia but apparently this was related to the encephalopathy in the context of the ketoacidosis. The patient lives by himself in a condo at [facility], [place]. He has not seen his primary care physician for at least 2 years. He has been following no diet. He was found to have mild tachypnea, leukocytosis, and glucose of 397, & anion gap of 22. Insulin drip and IV fluids were initiated. The patient was transitioned to Lantus and sliding scale insulin. Renal insufficiency was present with a creatinine 1.55 related to dehydration.At the time of discharge creatinine was 0.6. He was also found to have a left lower lobe pneumonia vs atelectasis.  Because of mental status changes VDRL & TSH were therapeutic are normal. IV Levaquin was started after CT suggested pneumonia rather than atelectasis. Patient was found to have dysphagia with documented aspiration. Modified swallow revealed pooling in the piriform sinus and vallecula. He was placed on chopped, nectar thick diet. He had a history of stroke 3 years ago MRI revealed no acute change. C.  difficile was negative. Diarrhea improved   He did have elevated troponins attributed to demand ischemia. Echo revealed ejection fraction of 50-60 percent with grade 2 diastolic dysfunction w/o wall abnormalities. He was to continue aspirin, beta blocker, statin. Discharge labs revealed glucose of 296, creatinine 0.67 and normochromic normocytic anemia. Hematocrit was 34.1.White count was normal.  Past medical and surgical history:Hypertension, dyslipidemia, erectile dysfunction, anxiety and depression .No surgical history on file.   Social history: [place]   Family history:Reviewed   Review of systems: States that his dysphagia and his cough are both improving. He denies any other active symptoms.  Constitutional: No fever,significant weight change, fatigue  Eyes: No redness, discharge, pain, vision change ENT/mouth: No nasal congestion,  purulent discharge, earache,change in hearing ,sore throat  Cardiovascular: No chest pain, palpitations,paroxysmal nocturnal dyspnea, claudication, edema  Respiratory: No hemoptysis, significant snoring,apnea  Gastrointestinal: No heartburn,abdominal pain, nausea / vomiting,rectal bleeding, melena,change in bowels Genitourinary: No dysuria,hematuria, pyuria,  incontinence, nocturia Musculoskeletal: No joint stiffness, joint swelling, weakness,pain Dermatologic: No rash, pruritus, change in appearance of skin Neurologic: No dizziness,headache,syncope, seizures, numbness , tingling Psychiatric: No significant anxiety , depression, insomnia, anorexia Endocrine: No change in hair/skin/ nails, excessive thirst, excessive hunger, excessive urination  Hematologic/lymphatic: No significant bruising, lymphadenopathy,abnormal bleeding Allergy/immunology: No itchy/ watery eyes, significant sneezing, urticaria, angioedema  Physical exam:  Pertinent or positive findings: he is sitting eating while being evaluated by  speech therapy. He appears somewhat unsteady but all limbs are strong. pattern alopecia. He is fully oriented 4.   he does have decreased hearing. Heart rhythm is irregular. He has low-grade rhonchi especially at the bases. Pulses are  decreased. Plethora is present over the feet, this blanches with pressure. Upper extremities are atrophic.He has scattered keratoses and eschar  General appearance:Adequately nourished; no acute distress , increased work of breathing is present.   Lymphatic: No lymphadenopathy about the head, neck, axilla . Eyes: No conjunctival inflammation or lid edema is present. There is no scleral icterus. Ears:  External ear exam shows no significant lesions or deformities.   Nose:  External nasal examination shows no deformity or inflammation. Nasal mucosa are pink and moist without lesions ,exudates Oral exam: lips and gums are healthy appearing.There is no oropharyngeal erythema or exudate . Neck:  No thyromegaly, masses, tenderness noted.    Heart:  No gallop, murmur, click, rub .  Abdomen:Bowel sounds are normal. Abdomen is soft and nontender with no organomegaly, hernias,masses. GU: deferred  Extremities:  No cyanosis, clubbing,edema  Neurologic exam : Strength equal  in upper & lower extremities Balance,Rhomberg,finger to nose testing could not be completed due to clinical state Deep tendon reflexes are equal Skin: Warm & dry w/o tenting. No significant lesions or rash.  See clinical summary under each active problem in the Problem List with associated updated therapeutic plan

## 2016-04-10 NOTE — Patient Instructions (Signed)
See assessment and plan under each diagnosis in the problem list and acutely for this visit 

## 2016-04-10 NOTE — Assessment & Plan Note (Signed)
Speech therapy actively involved with the patient

## 2016-04-10 NOTE — Assessment & Plan Note (Signed)
Resolved See insulin dose adjustment

## 2016-04-10 NOTE — Assessment & Plan Note (Signed)
04/10/16 oriented 4 ;clinically no evidence of dementia Patient has been noncompliant with follow up and treatment of his diabetes Cancel DNR

## 2016-04-10 NOTE — Assessment & Plan Note (Signed)
Complete Levaquin 

## 2016-04-10 NOTE — Assessment & Plan Note (Signed)
Sliding scale insulin is not realistic or recommended in the SNF  basal insulin will be titrated and glucoses prior to the largest meal checked with titration of ac Novolog.

## 2016-04-13 ENCOUNTER — Encounter: Payer: Self-pay | Admitting: Nurse Practitioner

## 2016-04-13 ENCOUNTER — Non-Acute Institutional Stay (SKILLED_NURSING_FACILITY): Payer: Medicare Other | Admitting: Nurse Practitioner

## 2016-04-13 DIAGNOSIS — I10 Essential (primary) hypertension: Secondary | ICD-10-CM | POA: Diagnosis not present

## 2016-04-13 DIAGNOSIS — R1030 Lower abdominal pain, unspecified: Secondary | ICD-10-CM | POA: Insufficient documentation

## 2016-04-13 DIAGNOSIS — R339 Retention of urine, unspecified: Secondary | ICD-10-CM

## 2016-04-13 DIAGNOSIS — R300 Dysuria: Secondary | ICD-10-CM | POA: Diagnosis not present

## 2016-04-13 DIAGNOSIS — R110 Nausea: Secondary | ICD-10-CM | POA: Diagnosis not present

## 2016-04-13 DIAGNOSIS — R197 Diarrhea, unspecified: Secondary | ICD-10-CM | POA: Diagnosis not present

## 2016-04-13 LAB — BASIC METABOLIC PANEL
BUN: 11 mg/dL (ref 4–21)
Creatinine: 0.4 mg/dL — AB (ref 0.6–1.3)
GLUCOSE: 99 mg/dL
POTASSIUM: 4.1 mmol/L (ref 3.4–5.3)
SODIUM: 133 mmol/L — AB (ref 137–147)

## 2016-04-13 LAB — CBC AND DIFFERENTIAL
HCT: 34 % — AB (ref 41–53)
Hemoglobin: 11.2 g/dL — AB (ref 13.5–17.5)
Platelets: 519 10*3/uL — AB (ref 150–399)
WBC: 136 10^3/mL

## 2016-04-13 LAB — HEPATIC FUNCTION PANEL
ALT: 12 U/L (ref 10–40)
AST: 14 U/L (ref 14–40)
Alkaline Phosphatase: 120 U/L (ref 25–125)
Bilirubin, Total: 0.3 mg/dL

## 2016-04-13 NOTE — Progress Notes (Signed)
Careteam: Patient Care Team: Corwin Levins, MD as PCP - General  Advanced Directive information Does Patient Have a Medical Advance Directive?: No  No Known Allergies  Chief Complaint  Patient presents with  . Acute Visit    Resident is being seen for severe lower abdominal pain.      HPI: Patient is a 66 y.o. male seen at [facility] (room 303) for an acute visit. Pt has complaints of severe abdominal pain rated a 9/10 in severity that started yesterday. He describes the pain as constant, lower abdominal pain which began last evening after eating dinner.He is from [place] and was hospitalized here in [place] from 3/26-3/30 for DKA, uncontrolled DM and diarrhea. Blood sugar this AM was 132. Abdominal CT scan in the hospital showed no acute findings. He was also tested for C-diff, which was negative as well. Pt reports having 10-12 diarrhea episodes overnight, however nursing is reporting 3. Stool is described as loose and watery with no blood present. He has been wearing depends for urination due to recent urinary urgency. Denies abdominal pain related to eating or pain that radiates. He denies vomiting, although has been nauseated due to the pain. Denies hesitancy, back, or scrotal pain with urination. PMH consistent with DM, mild dementia, HTN, HLD, CVA, and elevated PSA.  Pt later reported dysuria.   Review of Systems:  Review of Systems  Constitutional: Positive for appetite change. Negative for activity change, chills, diaphoresis and fever.  Respiratory: Negative for cough, chest tightness and shortness of breath.   Cardiovascular: Negative for chest pain, palpitations and leg swelling.  Gastrointestinal: Positive for abdominal distention, abdominal pain, diarrhea and nausea. Negative for anal bleeding, blood in stool, constipation, rectal pain and vomiting.  Genitourinary: Positive for dysuria, frequency and urgency. Negative for difficulty urinating, flank pain, scrotal  swelling and testicular pain.  Musculoskeletal: Negative for back pain.  Skin: Negative for pallor.  Neurological: Negative for headaches.  Psychiatric/Behavioral: Positive for confusion.    Past Medical History:  Diagnosis Date  . ADJ DISORDER WITH MIXED ANXIETY & DEPRESSED MOOD 02/23/2010   Qualifier: Diagnosis of  By: Jonny Ruiz MD, Len Blalock   . CVA (cerebral vascular accident) [facility])   . DIABETES MELLITUS, TYPE II 12/24/2006   Qualifier: Diagnosis of  By: Jonny Ruiz MD, Len Blalock   . DKA (diabetic ketoacidoses) (HCC) 04/02/2016   inpatient  . Erectile dysfunction 01/26/2011  . HYPERLIPIDEMIA 12/24/2006   Qualifier: Diagnosis of  By: Jonny Ruiz MD, Len Blalock   . HYPERTENSION 12/24/2006   Qualifier: Diagnosis of  By: Jonny Ruiz MD, Len Blalock   . PSA, INCREASED 12/25/2006   Qualifier: Diagnosis of  By: Jonny Ruiz MD, Len Blalock    History reviewed. No pertinent surgical history. Social History:   reports that he has never smoked. He has never used smokeless tobacco. He reports that he does not drink alcohol or use drugs.  Family History  Problem Relation Age of Onset  . Cancer Mother   . Cancer Father     Medications: Patient's Medications  New Prescriptions   No medications on file  Previous Medications   AMBULATORY NON FORMULARY MEDICATION    Give 120 ml of NSA MedPass twice daily.   ASPIRIN 325 MG TABLET    Take 1 tablet (325 mg total) by mouth daily.   ATORVASTATIN (LIPITOR) 40 MG TABLET    Take 1 tablet (40 mg total) by mouth at bedtime. Must make yearly appt w/labs for future refills   CETAPHIL (  CETAPHIL) LOTION    Apply topically 2 (two) times daily. Apply to hands<br>Substitute Cetaphil Lotion for Lubriderm lotion   INSULIN ASPART (NOVOLOG) 100 UNIT/ML INJECTION    Inject 8 Units into the skin 3 (three) times daily before meals. Hold if glucose is less than 100.   INSULIN GLARGINE (LANTUS) 100 UNIT/ML INJECTION    Inject 15 Units into the skin daily.   LOPERAMIDE (IMODIUM) 2 MG CAPSULE    Take 1 capsule  (2 mg total) by mouth 3 (three) times daily as needed for diarrhea or loose stools.   METOPROLOL TARTRATE (LOPRESSOR) 25 MG TABLET    Take 1 tablet (25 mg total) by mouth 2 (two) times daily.  Modified Medications   No medications on file  Discontinued Medications   FEEDING SUPPLEMENT, ENSURE ENLIVE, (ENSURE ENLIVE) LIQD    Take 237 mLs by mouth 3 (three) times daily between meals.   INSULIN ASPART (NOVOLOG) 100 UNIT/ML INJECTION    Inject 0-9 Units into the skin 3 (three) times daily with meals. Sliding scale CBG 70 - 120: 0 units CBG 121 - 150: 1 unit,  CBG 151 - 200: 2 units,  CBG 201 - 250: 3 units,  CBG 251 - 300: 5 units,  CBG 301 - 350: 7 units,  CBG 351 - 400: 9 units   CBG > 400: 9 units and notify your MD   INSULIN ASPART (NOVOLOG) 100 UNIT/ML INJECTION    Inject 4 Units into the skin 3 (three) times daily with meals.   INSULIN GLARGINE (LANTUS) 100 UNIT/ML INJECTION    Inject 0.12 mLs (12 Units total) into the skin at bedtime.   MALTODEXTRIN-XANTHAN GUM (RESOURCE THICKENUP CLEAR) POWD    Nectar thick consistency     Physical Exam:  Vitals:   04/13/16 0919  BP: (!) 178/100  Pulse: 98  Resp: (!) 24  Temp: 97 F (36.1 C)  SpO2: 94%  Weight: 132 lb 3.2 oz (60 kg)  Height: 6' (1.829 m)   Body mass index is 17.93 kg/m.  Physical Exam  Constitutional: He is oriented to person, place, and time. He appears well-developed and well-nourished.  Non-toxic appearance.  HENT:  Head: Normocephalic and atraumatic.  Mouth/Throat: Oropharynx is clear and moist. No oropharyngeal exudate.  Eyes: Conjunctivae and EOM are normal. Pupils are equal, round, and reactive to light. Right eye exhibits no discharge. Left eye exhibits no discharge.  Cardiovascular: Normal rate, normal heart sounds and intact distal pulses.   Pulmonary/Chest: Effort normal and breath sounds normal. No respiratory distress. He has no wheezes.  Abdominal: Soft. Bowel sounds are normal. He exhibits distension. He  exhibits no pulsatile liver, no abdominal bruit and no pulsatile midline mass. There is no hepatosplenomegaly. There is tenderness in the suprapubic area. There is no rigidity, no rebound, no guarding and no CVA tenderness.  Tenderness and distension in suprapubic area, otherwise abdomen is soft and nontender  Neurological: He is alert and oriented to person, place, and time.  Skin: Skin is warm and dry.  Psychiatric: He has a normal mood and affect. His behavior is normal.    Labs reviewed: Basic Metabolic Panel:  Recent Labs  28/41/32 1823  04/04/16 0403 04/05/16 0239 04/06/16 0235 04/09/16  NA 141  < > 136 134* 137 137  K 3.6  < > 3.5 3.5 3.9 4.1  CL 103  < > 102 99* 99*  --   CO2 25  < > 24 27 32  --   GLUCOSE  142*  < > 80 211* 296*  --   BUN 34*  < > CREATININE 0.96  < > 0.51* 0.65 0.67 0.5*  CALCIUM 8.5*  < > 8.3* 8.1* 8.4*  --   TSH 0.931  --   --   --   --   --   < > = values in this interval not displayed. Liver Function Tests:  Recent Labs  04/02/16 1219  AST 15  ALT 17  ALKPHOS 103  BILITOT 2.1*  PROT 7.4  ALBUMIN 2.7*   No results for input(s): LIPASE, AMYLASE in the last 8760 hours. No results for input(s): AMMONIA in the last 8760 hours. CBC:  Recent Labs  04/02/16 1219  04/04/16 0403 04/05/16 0239 04/06/16 0235 04/09/16  WBC 13.0*  < > 7.7 6.8 6.7 7.8  NEUTROABS 12.0*  --   --   --   --   --   HGB 13.2  < > 10.9* 10.5* 11.2* 10.4*  HCT 39.7  < > 33.7* 32.8* 34.1* 33*  MCV 87.8  < > 86.2 86.1 86.8  --   PLT 327  < > 265 268 282 340  < > = values in this interval not displayed. Lipid Panel: No results for input(s): CHOL, HDL, LDLCALC, TRIG, CHOLHDL, LDLDIRECT in the last 8760 hours. TSH:  Recent Labs  04/02/16 1823  TSH 0.931   A1C: Lab Results  Component Value Date   HGBA1C >15.5 (H) 04/04/2016     Assessment/Plan 1. Lower abdominal pain -Will obtain stat KUB for lower abdominal pain -Nursing to perform I&O cath to  assess for bladder distention. If greater than , leave catheter in place.  -Obtained CBC w/diff and BMP -CBC revealed elevated WBC however urine was negative.  -Continue with adequate hydration  2. Essential hypertension -recheck of blood pressure 162/87 down from 178/100, possibly pain related.   3. Urinary retention Noted to have 900 cc in bladder however there was not a indwelling catheter in house (only in and out per nursing), to apply condom cath and monitor output -to start Flomax 0.4 PO qhs   4. Dysuria UA unremarkable - it was noted to be cloudy but without wbc/nitrites, will get C&S  5. Nausea Without vomiting, worse when he eats, clear liquids and to advance as tolerates  6. Diarrhea, unspecified type -will start flagyl due to elevated wbc and send off for c diff since pt was on Levaquin during hospitalization for pneumonia.  Will get GI referral at this time.  -will start florastor PO BID        Devyne Hauger K. Biagio Borg  [facility] [phone] 8 am - 5 pm) [phone] (after hours)

## 2016-04-17 ENCOUNTER — Non-Acute Institutional Stay (SKILLED_NURSING_FACILITY): Payer: Medicare Other | Admitting: Internal Medicine

## 2016-04-17 ENCOUNTER — Encounter: Payer: Self-pay | Admitting: Internal Medicine

## 2016-04-17 ENCOUNTER — Other Ambulatory Visit: Payer: Self-pay | Admitting: *Deleted

## 2016-04-17 DIAGNOSIS — R197 Diarrhea, unspecified: Secondary | ICD-10-CM | POA: Diagnosis not present

## 2016-04-17 DIAGNOSIS — R972 Elevated prostate specific antigen [PSA]: Secondary | ICD-10-CM | POA: Diagnosis not present

## 2016-04-17 DIAGNOSIS — E0810 Diabetes mellitus due to underlying condition with ketoacidosis without coma: Secondary | ICD-10-CM

## 2016-04-17 DIAGNOSIS — R1030 Lower abdominal pain, unspecified: Secondary | ICD-10-CM | POA: Diagnosis not present

## 2016-04-17 NOTE — Assessment & Plan Note (Signed)
Titrate basal & ac Novolog as ac glucoses > 200

## 2016-04-17 NOTE — Progress Notes (Signed)
Facility Location: [facility]  Room Number: 303- A  This is a nursing facility follow up for specific acute issue of abdominal pain, urinary retention, and diarrhea. Also follow-up of his diabetes was requested as glucoses prelunch were 215 and 238.  Interim medical record and care since last [facility] visit was updated with review of diagnostic studies and change in clinical status since last visit were documented.  HPI: At this time the patient denies abdominal pain. He states the stool today was formed. He denies any clay colored stool, melena, rectal bleeding. He denies any genitourinary symptoms but he does have a Foley catheter in place. He states he has no history of any prostate or other urologic issues; but Dr Jonny Ruiz documented elevated PSA 12/25/2006 & diagnosed ED.PSA was 2.90 on  03/09/2014 . The patient was seen 04/13/16 for lower abdominal pain. The pain was described as severe and rated 9 out of 10. The pain began 4/5 after the evening meal and was associated with nausea without vomiting. Pain was described as constant. Also the patient described 10-12 diarrheal stools overnight. Staff reported 3 stools which were loose and watery. Also the patient described some dysuria, frequency, urgency. Urinalysis was negative for nitrates or white cells. It did appear somewhat cloudy, C&S was collected & is pending. In & out cath  performed to assess bladder distention. He was found to have 900 mL in the bladder and condom cath was applied to monitor output. Flomax was initiated. Flagyl was initiated and stool sent for C. difficile. Probiotic was initiated. GI consult requested. KUB 4/6 revealed moderate constipation with a nonobstructive bowel gas pattern. White count was 13,600 with left shift.. He also exhibited a mild anemia with hemoglobin 11.2. This had improved from a value of 10.4 on 4/2. WBC 7800. Chemistries revealed a low calcium of 7.6 and  hyponatremia with a value of 133. Liver  function was normal. Amylase was normal. He did have a low albumin of 2.5. He was admitted to the SNF following hospitalization for DKA. He did also have diarrhea. Abdominal CT 04/03/16 revealed mesenteric edema suggesting anasarca ; there was no evidence of colitis. An incidental finding was by basilar infiltrate suggestive of community acquired pneumonia for which he received Levaquin.  Review of systems:  Constitutional: No fever,significant weight change, fatigue  Cardiovascular: No chest pain, palpitations,paroxysmal nocturnal dyspnea, claudication, edema  Respiratory: No cough, sputum production,hemoptysis, DOE , significant snoring,apnea  Genitourinary: No dysuria,hematuria, pyuria Endocrine: No change in hair/skin/ nails, excessive thirst, excessive hunger, excessive urination   Physical exam:  Pertinent or positive findings: He appears somewhat cachectic intercostal wasting. Speech is slightly slurred. The catheter is in place. General appearance: no acute distress , increased work of breathing is present.   Lymphatic: No lymphadenopathy about the head, neck, axilla . Eyes: No conjunctival inflammation or lid edema is present. There is no scleral icterus. Ears:  External ear exam shows no significant lesions or deformities.   Nose:  External nasal examination shows no deformity or inflammation. Nasal mucosa are pink and moist without lesions ,exudates Oral exam: lips and gums are healthy appearing.There is no oropharyngeal erythema or exudate . Neck:  No thyromegaly, masses, tenderness noted.    Heart:  Normal rate and regular rhythm. S1 and S2 normal without gallop, murmur, click, rub .  Lungs:Chest clear to auscultation without wheezes, rhonchi,rales , rubs. Abdomen:Bowel sounds are normal. Abdomen is soft and nontender with no organomegaly, hernias,masses. GU: deferred  Extremities:  No cyanosis, clubbing,edema  Skin: Warm & dry w/o  tenting. No significant lesions or rash.  See summary under each active problem in the Problem List with associated updated therapeutic plan

## 2016-04-17 NOTE — Patient Outreach (Signed)
[facility]) Care Management  04/17/2016  Jake Chen 11/15/1950 [id number]   Met wit patient at bedside. Per EPIC patient has DM and extremely elevated A1C level, greater than 15.5. RNCM reviewed [facility] care management program.  RNCM gave patient a calendar to record CBGs and gave [facility] brochure.  Patient wishes to review program services and think about participation.  Plan to see patient at next facility visit to further discuss [facility] care management.   Royetta Crochet. Laymond Purser, RN, BSN, [place] [phone]) Business Cell  [phone]) Toll Free Office

## 2016-04-17 NOTE — Assessment & Plan Note (Signed)
Repeat PSA if urinary retention symptoms persist following removal of the Foley

## 2016-04-17 NOTE — Patient Instructions (Signed)
See assessment and plan under each diagnosis in the problem list and acutely for this visit 

## 2016-04-20 ENCOUNTER — Telehealth: Payer: Self-pay | Admitting: Internal Medicine

## 2016-04-20 NOTE — Telephone Encounter (Signed)
Attempted to call patient to schedule awv. Patient did not have vm set up was not able to leave msg.

## 2016-04-24 ENCOUNTER — Other Ambulatory Visit: Payer: Self-pay | Admitting: *Deleted

## 2016-04-24 DIAGNOSIS — E0800 Diabetes mellitus due to underlying condition with hyperosmolarity without nonketotic hyperglycemic-hyperosmolar coma (NKHHC): Secondary | ICD-10-CM

## 2016-04-24 NOTE — Patient Outreach (Signed)
[facility]) Care Management  04/24/2016  CHUONG CASEBEER 1950-08-19 [id number]   Met with patient at facility to follow up on [facility] care management services discussed with him last week.  Patient states he is going home 04/26/16.  He agrees to telephone calls, but will not sign consent form, stating "I don't do like to sign" but gives verbal consent to [facility] care management through phone calls or meeting at MD office in [place].  Patient endorses Dr. Cathlean Cower as primary care physician. Pt reports he lives alone and goes out to eat for socialization.  Pt would appreciate education on diabetes and diet. A1C is greater than 15.5  Patient gives his daughter, Osvaldo Angst as emergency contact: [phone] Patient gives e-mail address as: [email or web address]  Met with Cameron Ali, SW at facility, she is setting up Home care through [facility] in [place]. Made her aware of [facility] Care management involvement and it would not interfere or replace any services she set up.   Plan to refer to [facility] Management for transition of care program calls and visits with patient at MD office.  Royetta Crochet. Laymond Purser, RN, BSN, [place] [phone]) Business Cell  [phone]) Toll Free Office

## 2016-04-25 ENCOUNTER — Encounter: Payer: Self-pay | Admitting: Nurse Practitioner

## 2016-04-25 ENCOUNTER — Non-Acute Institutional Stay (SKILLED_NURSING_FACILITY): Payer: Medicare Other | Admitting: Nurse Practitioner

## 2016-04-25 DIAGNOSIS — E0810 Diabetes mellitus due to underlying condition with ketoacidosis without coma: Secondary | ICD-10-CM

## 2016-04-25 DIAGNOSIS — R339 Retention of urine, unspecified: Secondary | ICD-10-CM | POA: Diagnosis not present

## 2016-04-25 DIAGNOSIS — R1312 Dysphagia, oropharyngeal phase: Secondary | ICD-10-CM

## 2016-04-25 DIAGNOSIS — K5900 Constipation, unspecified: Secondary | ICD-10-CM

## 2016-04-25 DIAGNOSIS — I10 Essential (primary) hypertension: Secondary | ICD-10-CM | POA: Diagnosis not present

## 2016-04-25 MED ORDER — METOPROLOL TARTRATE 25 MG PO TABS: 25.0000 mg | ORAL_TABLET | Freq: Two times a day (BID) | ORAL | 0 refills | Status: AC

## 2016-04-25 MED ORDER — TAMSULOSIN HCL 0.4 MG PO CAPS: 0.4000 mg | ORAL_CAPSULE | Freq: Every day | ORAL | 0 refills | Status: AC

## 2016-04-25 MED ORDER — INSULIN GLARGINE 100 UNIT/ML ~~LOC~~ SOLN: 20.0000 [IU] | Freq: Every day | SUBCUTANEOUS | 0 refills | Status: AC

## 2016-04-25 MED ORDER — ATORVASTATIN CALCIUM 40 MG PO TABS: 40.0000 mg | ORAL_TABLET | Freq: Every day | ORAL | 0 refills | Status: AC

## 2016-04-25 MED ORDER — PEN NEEDLES 30G X 8 MM MISC: 1.0000 | Freq: Three times a day (TID) | 0 refills | Status: AC

## 2016-04-25 MED ORDER — INSULIN ASPART 100 UNIT/ML ~~LOC~~ SOLN: SUBCUTANEOUS | 0 refills | Status: DC

## 2016-04-25 MED ORDER — INSULIN ASPART 100 UNIT/ML FLEXPEN: PEN_INJECTOR | SUBCUTANEOUS | 0 refills | Status: AC

## 2016-04-25 NOTE — Progress Notes (Signed)
Nursing Home Location:  [facility]  Place of Service: SNF (31)  PCP: [name], MD  No Known Allergies  Chief Complaint  Patient presents with  . Discharge Note    Resident is discharging from SNF.     HPI:  Patient is a 66 y.o. male seen today at [facility] for discharge. Pt at [facility] after hospitalization from 3/26-3/30 for DKA, uncontrolled DM and diarrhea. Blood sugars fasting have been from 75-200s, and post prandial  150-300s Since he has been at [facility] had episode of abdominal pain, increase diarrhea and urinary retention. KUB revealed constipation and he was placed on miralax. Since pt reports diarrhea has significantly improved. He was having episodes of incontinence of bowel but this has resolved. Pts Abdominal CT scan in the hospital showed no acute findings. He was also tested for C-diff, which was negative as well.  For urinary retention foley was placed. He was put on flomax, UA sent but was negative. Foley was dc'd and pt has been voiding without problem.  Pt is scheduled to go home tomorrow. He plans to drive home to [place] (~1 hour and 40 mins away)   Review of Systems:  Review of Systems  Constitutional: Positive for appetite change (improved appetite). Negative for activity change, chills, diaphoresis and fever.  Respiratory: Negative for cough, chest tightness and shortness of breath.   Cardiovascular: Negative for chest pain, palpitations and leg swelling.  Gastrointestinal: Negative for abdominal distention, abdominal pain, anal bleeding, blood in stool, constipation, diarrhea, nausea, rectal pain and vomiting.  Genitourinary: Negative for difficulty urinating, dysuria, flank pain, frequency, scrotal swelling, testicular pain and urgency.  Musculoskeletal: Negative for back pain.  Skin: Negative for pallor.  Neurological: Negative for headaches.  Psychiatric/Behavioral: Positive for confusion.    Past Medical History:  Diagnosis  Date  . ADJ DISORDER WITH MIXED ANXIETY & DEPRESSED MOOD 02/23/2010   Qualifier: Diagnosis of  By: Jonny Ruiz MD, Len Blalock   . CVA (cerebral vascular accident) [facility])   . DIABETES MELLITUS, TYPE II 12/24/2006   Qualifier: Diagnosis of  By: Jonny Ruiz MD, Len Blalock   . DKA (diabetic ketoacidoses) (HCC) 04/02/2016   inpatient  . Erectile dysfunction 01/26/2011  . HYPERLIPIDEMIA 12/24/2006   Qualifier: Diagnosis of  By: Jonny Ruiz MD, Len Blalock   . HYPERTENSION 12/24/2006   Qualifier: Diagnosis of  By: Jonny Ruiz MD, Len Blalock   . PSA, INCREASED 12/25/2006   Qualifier: Diagnosis of  By: Jonny Ruiz MD, Len Blalock    History reviewed. No pertinent surgical history. Social History:   reports that he has never smoked. He has never used smokeless tobacco. He reports that he does not drink alcohol or use drugs.  Family History  Problem Relation Age of Onset  . Cancer Mother   . Cancer Father     Medications: Patient's Medications  New Prescriptions   No medications on file  Previous Medications   AMBULATORY NON FORMULARY MEDICATION    Give 120 ml of NSA MedPass twice daily.   ASPIRIN 325 MG TABLET    Take 1 tablet (325 mg total) by mouth daily.   ATORVASTATIN (LIPITOR) 40 MG TABLET    Take 1 tablet (40 mg total) by mouth at bedtime. Must make yearly appt w/labs for future refills   CETAPHIL (CETAPHIL) LOTION    Apply topically 2 (two) times daily. Apply to hands<br>Substitute Cetaphil Lotion for Lubriderm lotion   INSULIN ASPART (NOVOLOG) 100 UNIT/ML INJECTION    Inject 4 units  before breakfast and lunch and 8 units before evening meal.   INSULIN GLARGINE (LANTUS) 100 UNIT/ML INJECTION    Inject 20 Units into the skin daily.    LOPERAMIDE (IMODIUM) 2 MG CAPSULE    Take 1 capsule (2 mg total) by mouth 3 (three) times daily as needed for diarrhea or loose stools.   METOPROLOL TARTRATE (LOPRESSOR) 25 MG TABLET    Take 1 tablet (25 mg total) by mouth 2 (two) times daily.   POLYETHYLENE GLYCOL (MIRALAX / GLYCOLAX) PACKET    Take 17 g  by mouth daily. Mix with 8 oz of water   TAMSULOSIN (FLOMAX) 0.4 MG CAPS CAPSULE    Take 0.4 mg by mouth at bedtime.  Modified Medications   No medications on file  Discontinued Medications   INSULIN ASPART (NOVOLOG) 100 UNIT/ML INJECTION    Inject 8 Units into the skin 3 (three) times daily before meals. Hold if glucose is less than 100.     Physical Exam: Vitals:   04/25/16 1100  BP: 114/77  Pulse: 70  Resp: 16  Temp: 97.6 F (36.4 C)  SpO2: 96%  Weight: 134 lb (60.8 kg)  Height: 6' (1.829 m)    Physical Exam  Constitutional: He is oriented to person, place, and time.  Non-toxic appearance.  Frail male, appears older than stated age  HENT:  Head: Normocephalic and atraumatic.  Mouth/Throat: Oropharynx is clear and moist. No oropharyngeal exudate.  Eyes: Conjunctivae and EOM are normal. Pupils are equal, round, and reactive to light.  Cardiovascular: Normal rate, normal heart sounds and intact distal pulses.   Pulmonary/Chest: Effort normal and breath sounds normal. No respiratory distress. He has no wheezes.  Abdominal: Soft. Bowel sounds are normal. He exhibits no pulsatile liver, no abdominal bruit and no pulsatile midline mass. There is no hepatosplenomegaly. There is no rigidity and no CVA tenderness.  Musculoskeletal: He exhibits no edema or tenderness.  Neurological: He is alert and oriented to person, place, and time.  Skin: Skin is warm and dry.  Multiple abrasions on hands and feet on admission, mostly healed but Abrasion on right great toe with surrounding erythema, staff reports this has been unchanged/improved from admission.   Psychiatric: He has a normal mood and affect. His behavior is normal.    Labs reviewed: Basic Metabolic Panel:  Recent Labs  16/10/96 0403 04/05/16 0239 04/06/16 0235 04/09/16 04/13/16 0035  NA 136 134* 137 137 133*  K 3.5 3.5 3.9 4.1 4.1  CL 102 99* 99*  --   --   CO2 24 27 32  --   --   GLUCOSE 80 211* 296*  --   --   BUN CREATININE 0.51* 0.65 0.67 0.5* 0.4*  CALCIUM 8.3* 8.1* 8.4*  --   --    Liver Function Tests:  Recent Labs  04/02/16 1219 04/13/16 0035  AST 15 14  ALT 17 12  ALKPHOS 103 120  BILITOT 2.1*  --   PROT 7.4  --   ALBUMIN 2.7*  --    No results for input(s): LIPASE, AMYLASE in the last 8760 hours. No results for input(s): AMMONIA in the last 8760 hours. CBC:  Recent Labs  04/02/16 1219  04/04/16 0403 04/05/16 0239 04/06/16 0235 04/09/16 04/13/16 0035  WBC 13.0*  < > 7.7 6.8 6.7 7.8 136.0  NEUTROABS 12.0*  --   --   --   --   --   --  HGB 13.2  < > 10.9* 10.5* 11.2* 10.4* 11.2*  HCT 39.7  < > 33.7* 32.8* 34.1* 33* 34*  MCV 87.8  < > 86.2 86.1 86.8  --   --   PLT 327  < > 265 268 282 340 519*  < > = values in this interval not displayed. TSH:  Recent Labs  04/02/16 1823  TSH 0.931   A1C: Lab Results  Component Value Date   HGBA1C >15.5 (H) 04/04/2016   Lipid Panel: No results for input(s): CHOL, HDL, LDLCALC, TRIG, CHOLHDL, LDLDIRECT in the last 8760 hours.  Radiological Exams: Dg Chest 2 View  Result Date: 04/03/2016 CLINICAL DATA:  Follow-up pneumonia. EXAM: CHEST  2 VIEW COMPARISON:  Portable chest x-ray of April 02, 2016 FINDINGS: The lungs remain hyperinflated. The interstitial markings in the lower lobes remain increased and are slightly more conspicuous. There is no pleural effusion or pneumothorax. The heart and pulmonary vascularity are normal. The mediastinum is normal in width. The bony thorax exhibits no acute abnormality. IMPRESSION: Persistent bilateral lower lobe pneumonia. Slight interval deterioration since yesterday's study. Electronically Signed   By: David  [place] M.D.   On: 04/03/2016 14:49   Ct Abdomen Pelvis W Contrast  Result Date: 04/03/2016 CLINICAL DATA:  Diarrhea for over 2 weeks. EXAM: CT ABDOMEN AND PELVIS WITH CONTRAST TECHNIQUE: Multidetector CT imaging of the abdomen and pelvis was performed using the standard protocol  following bolus administration of intravenous contrast. CONTRAST:  100 ml ISOVUE-300 IOPAMIDOL (ISOVUE-300) INJECTION 61% COMPARISON:  Single-view of the chest 04/02/2016. PA and lateral chest 01/22/2015. FINDINGS: Lower chest: There is no pleural or pericardial effusion. Bilateral lower lobe airspace disease has an appearance most worrisome for bronchopneumonia. Hepatobiliary: No focal liver abnormality is seen. No gallstones, gallbladder wall thickening, or biliary dilatation. Pancreas: Unremarkable. No pancreatic ductal dilatation or surrounding inflammatory changes. Spleen: Normal in size without focal abnormality. Adrenals/Urinary Tract: Adrenal glands are unremarkable. Kidneys are normal, without renal calculi, focal lesion, or hydronephrosis. Bladder is unremarkable. Stomach/Bowel: Stomach is within normal limits. Appendix appears normal. No evidence of bowel wall thickening, distention, or inflammatory changes. Vascular/Lymphatic: A few scattered aortoiliac atherosclerotic calcifications without aneurysm are identified. Reproductive: Prostate gland is prominent. Other: There is diffuse body wall and mesenteric edema. No focal fluid collection is identified. The patient appears cachectic. Musculoskeletal: No acute or focal bony abnormality is identified. IMPRESSION: Bilateral lower lobe airspace disease with an appearance most worrisome for pneumonia. Negative for colitis. Body wall and mesenteric edema most consistent with anasarca. Atherosclerosis. Electronically Signed   By: Drusilla Kanner M.D.   On: 04/03/2016 14:38   Dg Chest Port 1 View  Result Date: 04/02/2016 CLINICAL DATA:  Fatigue, weakness, and diarrhea for the past 2 weeks with weight loss over the past several months. The patient is experiencing elevated blood pressure, tachycardia, and low oxygen saturation today. EXAM: PORTABLE CHEST 1 VIEW COMPARISON:  Chest x-ray of January 22, 2015 FINDINGS: The lungs are hyperinflated. There is  infiltrate in the left lower lobe posteriorly. There is no pleural effusion or pneumothorax. The heart and pulmonary vascularity are normal. The mediastinum is normal in width. The bony thorax exhibits no acute fracture. Left lower lateral rib fractures appear to have healed. IMPRESSION: Left lower lobe atelectasis or pneumonia superimposed upon reactive airway disease or COPD. A PA and lateral chest x-ray would be useful. Electronically Signed   By: David  [place] M.D.   On: 04/02/2016 15:48    Assessment/Plan 1. Diabetic ketoacidosis  without coma associated with diabetes mellitus due to underlying condition (HCC) -lantus has been increased to 20 units qhs with novolog meal coverage 4 untis before breakfast an lunch and 8 units before evening meal. Staff to provide education prior to discharge for insulin teaching. Home health nursing also for ongoing education.   2. Urinary retention Improved, cont on flomax   3. Constipation, unspecified constipation type With overflow diarrhea. Has resolved. Pt with regular BM at this time.   4. Essential hypertension -stable on lopressor   5. Oropharyngeal dysphagia conts to work with ST, hx of CVA, improved. Will have HH ST evaluate.   pt is stable for discharge-will need PT/OT/nursing/st/SW per home health. DME: rollator. After speaking with staff it is not reecommended for pt to drive home after discharge. Pt aware and agreeable to this SW to notify daughter.  Rx sent via epic for 1 month supply.  will need to follow up with PCP within 2 weeks.    Janene Harvey. Biagio Borg  [facility] [phone] 8 am - 5 pm) [phone] (after hours)

## 2016-04-26 DIAGNOSIS — H6123 Impacted cerumen, bilateral: Secondary | ICD-10-CM | POA: Diagnosis not present

## 2016-04-26 DIAGNOSIS — E119 Type 2 diabetes mellitus without complications: Secondary | ICD-10-CM | POA: Diagnosis not present

## 2016-04-30 ENCOUNTER — Other Ambulatory Visit: Payer: Self-pay

## 2016-04-30 NOTE — Patient Outreach (Signed)
[facility]) Care Management  04/30/16  Jake Chen 1950-12-24 [id number]  Attempted to reach patient without success. Left HIPAA compliant voicemail with RNCM contact information and invited callback.  Turkey R. Martena Emanuele, RN, BSN, CCM [facility] Care Management Coordinator [phone]

## 2016-05-01 ENCOUNTER — Ambulatory Visit: Payer: Self-pay

## 2016-05-01 ENCOUNTER — Ambulatory Visit: Payer: Medicare Other | Admitting: Internal Medicine

## 2016-05-02 ENCOUNTER — Other Ambulatory Visit: Payer: Self-pay

## 2016-05-02 NOTE — Patient Outreach (Signed)
[facility]) Care Management  05/02/16  Jake Chen 1950-09-24  [id number]  Second attempt to reach patient without success. Left HIPAA compliant voicemail with RNCM contact information and invited callback.  Turkey R. Keyarra Rendall, RN, BSN, CCM [facility] Care Management Coordinator [phone]

## 2016-05-04 ENCOUNTER — Other Ambulatory Visit: Payer: Self-pay

## 2016-05-04 NOTE — Patient Outreach (Signed)
[facility]) Care Management  05/04/16  BARNABY RIPPEON 08-Sep-1950 [id number]  Third and final outreach attempt completed without success. Left HIPAA compliant voicemail with RNCM contact information and requested callback.   Plan- RNCM will send outreach barrier letter and wait 10 business days before completing case closure if no return call has been received.  Turkey R. Arlen Dupuis, RN, BSN, CCM [facility] Care Management Coordinator [phone]

## 2016-05-21 ENCOUNTER — Other Ambulatory Visit: Payer: Self-pay

## 2016-05-21 NOTE — Patient Outreach (Signed)
[facility] Care Management  05/21/16  Crissie FiguresRonald H Benningfield 29-Oct-1950 [id number]  RNCM has not received any outreach calls from patient since sending outreach letter attempt. Will perform case closure.  TurkeyVictoria R. Yashas Camilli, RN, BSN, CCM [facility] Care Management Coordinator [phone]

## 2016-08-23 ENCOUNTER — Telehealth: Payer: Self-pay | Admitting: Internal Medicine

## 2016-08-23 DIAGNOSIS — E1100 Type 2 diabetes mellitus with hyperosmolarity without nonketotic hyperglycemic-hyperosmolar coma (NKHHC): Secondary | ICD-10-CM | POA: Diagnosis not present

## 2016-08-23 DIAGNOSIS — E1110 Type 2 diabetes mellitus with ketoacidosis without coma: Secondary | ICD-10-CM | POA: Diagnosis not present

## 2016-08-23 DIAGNOSIS — R197 Diarrhea, unspecified: Secondary | ICD-10-CM | POA: Diagnosis not present

## 2016-08-23 DIAGNOSIS — E876 Hypokalemia: Secondary | ICD-10-CM | POA: Diagnosis not present

## 2016-08-23 DIAGNOSIS — Z9114 Patient's other noncompliance with medication regimen: Secondary | ICD-10-CM | POA: Diagnosis not present

## 2016-08-23 DIAGNOSIS — E1165 Type 2 diabetes mellitus with hyperglycemia: Secondary | ICD-10-CM | POA: Diagnosis not present

## 2016-08-23 DIAGNOSIS — J189 Pneumonia, unspecified organism: Secondary | ICD-10-CM | POA: Diagnosis not present

## 2016-08-23 DIAGNOSIS — E119 Type 2 diabetes mellitus without complications: Secondary | ICD-10-CM | POA: Diagnosis not present

## 2016-08-23 DIAGNOSIS — N179 Acute kidney failure, unspecified: Secondary | ICD-10-CM | POA: Diagnosis not present

## 2016-08-23 DIAGNOSIS — A419 Sepsis, unspecified organism: Secondary | ICD-10-CM | POA: Diagnosis present

## 2016-08-23 DIAGNOSIS — G9341 Metabolic encephalopathy: Secondary | ICD-10-CM | POA: Diagnosis present

## 2016-08-23 DIAGNOSIS — R531 Weakness: Secondary | ICD-10-CM | POA: Diagnosis not present

## 2016-08-23 DIAGNOSIS — E860 Dehydration: Secondary | ICD-10-CM | POA: Diagnosis not present

## 2016-08-23 DIAGNOSIS — R32 Unspecified urinary incontinence: Secondary | ICD-10-CM | POA: Diagnosis present

## 2016-08-23 DIAGNOSIS — E43 Unspecified severe protein-calorie malnutrition: Secondary | ICD-10-CM | POA: Diagnosis present

## 2016-08-23 DIAGNOSIS — R262 Difficulty in walking, not elsewhere classified: Secondary | ICD-10-CM | POA: Diagnosis not present

## 2016-08-23 DIAGNOSIS — J159 Unspecified bacterial pneumonia: Secondary | ICD-10-CM | POA: Diagnosis not present

## 2016-08-23 DIAGNOSIS — Z681 Body mass index (BMI) 19 or less, adult: Secondary | ICD-10-CM | POA: Diagnosis not present

## 2016-08-23 DIAGNOSIS — J9601 Acute respiratory failure with hypoxia: Secondary | ICD-10-CM | POA: Diagnosis present

## 2016-08-23 DIAGNOSIS — Z599 Problem related to housing and economic circumstances, unspecified: Secondary | ICD-10-CM | POA: Diagnosis not present

## 2016-08-23 DIAGNOSIS — J154 Pneumonia due to other streptococci: Secondary | ICD-10-CM | POA: Diagnosis present

## 2016-08-23 DIAGNOSIS — I639 Cerebral infarction, unspecified: Secondary | ICD-10-CM | POA: Diagnosis not present

## 2016-08-23 NOTE — Telephone Encounter (Signed)
Son called states that patient is really sick and refusing EMS care.  States he was told the only way he can get treatment for his dad is for Dr. Jonny RuizJohn to call a Medical Detention Order in to the local magistrates office.  Did inform son that I did not know if this could be done.

## 2016-08-23 NOTE — Telephone Encounter (Signed)
Routing back to dr Jonny Ruizjohn, Lorain Childesfyi...son has advised that [facility]---he has pneumonia and dKA---

## 2016-08-23 NOTE — Telephone Encounter (Signed)
Very sorry, but I would not know this is done, and have never done this before.  I suspect I would have to certify he has a mental illness, which I am not sure if would accurate.  Im not sure how else to help, except to say if he becomes confused or less alert, the family can call EMS even without his permission to seek help

## 2016-08-23 NOTE — Telephone Encounter (Signed)
Routing to dr john----do we do this---or do you know how I should proceed with this?

## 2016-08-27 DIAGNOSIS — R0989 Other specified symptoms and signs involving the circulatory and respiratory systems: Secondary | ICD-10-CM | POA: Diagnosis not present

## 2016-08-27 DIAGNOSIS — N183 Chronic kidney disease, stage 3 (moderate): Secondary | ICD-10-CM | POA: Diagnosis not present

## 2016-08-27 DIAGNOSIS — B001 Herpesviral vesicular dermatitis: Secondary | ICD-10-CM | POA: Diagnosis not present

## 2016-08-27 DIAGNOSIS — I639 Cerebral infarction, unspecified: Secondary | ICD-10-CM | POA: Diagnosis not present

## 2016-08-27 DIAGNOSIS — N179 Acute kidney failure, unspecified: Secondary | ICD-10-CM | POA: Diagnosis not present

## 2016-08-27 DIAGNOSIS — R262 Difficulty in walking, not elsewhere classified: Secondary | ICD-10-CM | POA: Diagnosis not present

## 2016-08-27 DIAGNOSIS — J159 Unspecified bacterial pneumonia: Secondary | ICD-10-CM | POA: Diagnosis not present

## 2016-08-27 DIAGNOSIS — R531 Weakness: Secondary | ICD-10-CM | POA: Diagnosis not present

## 2016-08-27 DIAGNOSIS — R197 Diarrhea, unspecified: Secondary | ICD-10-CM | POA: Diagnosis not present

## 2016-08-27 DIAGNOSIS — J189 Pneumonia, unspecified organism: Secondary | ICD-10-CM | POA: Diagnosis not present

## 2016-08-27 DIAGNOSIS — A419 Sepsis, unspecified organism: Secondary | ICD-10-CM | POA: Diagnosis not present

## 2016-08-27 DIAGNOSIS — J988 Other specified respiratory disorders: Secondary | ICD-10-CM | POA: Diagnosis not present

## 2016-08-27 DIAGNOSIS — E11319 Type 2 diabetes mellitus with unspecified diabetic retinopathy without macular edema: Secondary | ICD-10-CM | POA: Diagnosis not present

## 2016-08-27 DIAGNOSIS — M6281 Muscle weakness (generalized): Secondary | ICD-10-CM | POA: Diagnosis not present

## 2016-08-27 DIAGNOSIS — E1165 Type 2 diabetes mellitus with hyperglycemia: Secondary | ICD-10-CM | POA: Diagnosis not present

## 2016-08-27 DIAGNOSIS — E11649 Type 2 diabetes mellitus with hypoglycemia without coma: Secondary | ICD-10-CM | POA: Diagnosis not present

## 2016-08-27 DIAGNOSIS — E119 Type 2 diabetes mellitus without complications: Secondary | ICD-10-CM | POA: Diagnosis not present

## 2016-08-28 DIAGNOSIS — E11319 Type 2 diabetes mellitus with unspecified diabetic retinopathy without macular edema: Secondary | ICD-10-CM | POA: Diagnosis not present

## 2016-08-28 DIAGNOSIS — J159 Unspecified bacterial pneumonia: Secondary | ICD-10-CM | POA: Diagnosis not present

## 2016-08-28 DIAGNOSIS — A419 Sepsis, unspecified organism: Secondary | ICD-10-CM | POA: Diagnosis not present

## 2016-08-28 DIAGNOSIS — N183 Chronic kidney disease, stage 3 (moderate): Secondary | ICD-10-CM | POA: Diagnosis not present

## 2016-09-03 DIAGNOSIS — E11649 Type 2 diabetes mellitus with hypoglycemia without coma: Secondary | ICD-10-CM | POA: Diagnosis not present

## 2016-09-07 DIAGNOSIS — J988 Other specified respiratory disorders: Secondary | ICD-10-CM | POA: Diagnosis not present

## 2016-09-07 DIAGNOSIS — J159 Unspecified bacterial pneumonia: Secondary | ICD-10-CM | POA: Diagnosis not present

## 2016-09-07 DIAGNOSIS — B001 Herpesviral vesicular dermatitis: Secondary | ICD-10-CM | POA: Diagnosis not present

## 2016-09-12 DIAGNOSIS — M6281 Muscle weakness (generalized): Secondary | ICD-10-CM | POA: Diagnosis not present

## 2016-09-12 DIAGNOSIS — E119 Type 2 diabetes mellitus without complications: Secondary | ICD-10-CM | POA: Diagnosis not present

## 2016-09-12 DIAGNOSIS — N183 Chronic kidney disease, stage 3 (moderate): Secondary | ICD-10-CM | POA: Diagnosis not present

## 2016-10-03 DIAGNOSIS — H52222 Regular astigmatism, left eye: Secondary | ICD-10-CM | POA: Diagnosis not present

## 2016-10-03 DIAGNOSIS — E119 Type 2 diabetes mellitus without complications: Secondary | ICD-10-CM | POA: Diagnosis not present

## 2016-10-03 DIAGNOSIS — H5211 Myopia, right eye: Secondary | ICD-10-CM | POA: Diagnosis not present

## 2016-10-03 DIAGNOSIS — H2513 Age-related nuclear cataract, bilateral: Secondary | ICD-10-CM | POA: Diagnosis not present

## 2016-10-03 DIAGNOSIS — H524 Presbyopia: Secondary | ICD-10-CM | POA: Diagnosis not present

## 2016-10-24 DIAGNOSIS — R2689 Other abnormalities of gait and mobility: Secondary | ICD-10-CM | POA: Diagnosis present

## 2016-10-24 DIAGNOSIS — R000 Tachycardia, unspecified: Secondary | ICD-10-CM | POA: Diagnosis not present

## 2016-10-24 DIAGNOSIS — J189 Pneumonia, unspecified organism: Secondary | ICD-10-CM | POA: Diagnosis not present

## 2016-10-24 DIAGNOSIS — E1110 Type 2 diabetes mellitus with ketoacidosis without coma: Secondary | ICD-10-CM | POA: Diagnosis present

## 2016-10-24 DIAGNOSIS — R079 Chest pain, unspecified: Secondary | ICD-10-CM | POA: Diagnosis not present

## 2016-10-24 DIAGNOSIS — E7800 Pure hypercholesterolemia, unspecified: Secondary | ICD-10-CM | POA: Diagnosis not present

## 2016-10-24 DIAGNOSIS — E1310 Other specified diabetes mellitus with ketoacidosis without coma: Secondary | ICD-10-CM | POA: Diagnosis not present

## 2016-10-24 DIAGNOSIS — R531 Weakness: Secondary | ICD-10-CM | POA: Diagnosis not present

## 2016-10-24 DIAGNOSIS — Z23 Encounter for immunization: Secondary | ICD-10-CM | POA: Diagnosis not present

## 2016-10-24 DIAGNOSIS — I10 Essential (primary) hypertension: Secondary | ICD-10-CM | POA: Diagnosis not present

## 2016-10-24 DIAGNOSIS — Z66 Do not resuscitate: Secondary | ICD-10-CM | POA: Diagnosis present

## 2016-10-24 DIAGNOSIS — R634 Abnormal weight loss: Secondary | ICD-10-CM | POA: Diagnosis not present

## 2016-10-24 DIAGNOSIS — Z9119 Patient's noncompliance with other medical treatment and regimen: Secondary | ICD-10-CM | POA: Diagnosis not present

## 2016-10-24 DIAGNOSIS — Z681 Body mass index (BMI) 19 or less, adult: Secondary | ICD-10-CM | POA: Diagnosis not present

## 2016-10-24 DIAGNOSIS — R2681 Unsteadiness on feet: Secondary | ICD-10-CM | POA: Diagnosis not present

## 2016-10-24 DIAGNOSIS — Z794 Long term (current) use of insulin: Secondary | ICD-10-CM | POA: Diagnosis not present

## 2016-10-24 DIAGNOSIS — Z9114 Patient's other noncompliance with medication regimen: Secondary | ICD-10-CM | POA: Diagnosis not present

## 2016-10-24 DIAGNOSIS — R0602 Shortness of breath: Secondary | ICD-10-CM | POA: Diagnosis not present

## 2016-10-24 DIAGNOSIS — R05 Cough: Secondary | ICD-10-CM | POA: Diagnosis not present

## 2016-10-24 DIAGNOSIS — J168 Pneumonia due to other specified infectious organisms: Secondary | ICD-10-CM | POA: Diagnosis not present

## 2016-10-24 DIAGNOSIS — E46 Unspecified protein-calorie malnutrition: Secondary | ICD-10-CM | POA: Diagnosis not present

## 2016-10-24 DIAGNOSIS — J154 Pneumonia due to other streptococci: Secondary | ICD-10-CM | POA: Diagnosis not present

## 2016-10-24 DIAGNOSIS — Z79899 Other long term (current) drug therapy: Secondary | ICD-10-CM | POA: Diagnosis not present

## 2016-10-24 DIAGNOSIS — R918 Other nonspecific abnormal finding of lung field: Secondary | ICD-10-CM | POA: Diagnosis not present

## 2016-10-24 DIAGNOSIS — R64 Cachexia: Secondary | ICD-10-CM | POA: Diagnosis not present

## 2016-11-07 ENCOUNTER — Inpatient Hospital Stay: Payer: Self-pay | Admitting: Internal Medicine

## 2016-11-18 DIAGNOSIS — Z9119 Patient's noncompliance with other medical treatment and regimen: Secondary | ICD-10-CM | POA: Diagnosis not present

## 2016-11-18 DIAGNOSIS — J9600 Acute respiratory failure, unspecified whether with hypoxia or hypercapnia: Secondary | ICD-10-CM | POA: Diagnosis not present

## 2016-11-18 DIAGNOSIS — J189 Pneumonia, unspecified organism: Secondary | ICD-10-CM | POA: Diagnosis not present

## 2016-11-18 DIAGNOSIS — R071 Chest pain on breathing: Secondary | ICD-10-CM | POA: Diagnosis not present

## 2016-11-18 DIAGNOSIS — E119 Type 2 diabetes mellitus without complications: Secondary | ICD-10-CM | POA: Diagnosis not present

## 2016-11-18 DIAGNOSIS — Z7984 Long term (current) use of oral hypoglycemic drugs: Secondary | ICD-10-CM | POA: Diagnosis not present

## 2016-11-19 DIAGNOSIS — R1310 Dysphagia, unspecified: Secondary | ICD-10-CM | POA: Diagnosis present

## 2016-11-19 DIAGNOSIS — J189 Pneumonia, unspecified organism: Secondary | ICD-10-CM | POA: Diagnosis not present

## 2016-11-19 DIAGNOSIS — R64 Cachexia: Secondary | ICD-10-CM | POA: Diagnosis not present

## 2016-11-19 DIAGNOSIS — S0083XA Contusion of other part of head, initial encounter: Secondary | ICD-10-CM | POA: Diagnosis not present

## 2016-11-19 DIAGNOSIS — R0602 Shortness of breath: Secondary | ICD-10-CM | POA: Diagnosis not present

## 2016-11-19 DIAGNOSIS — Z9119 Patient's noncompliance with other medical treatment and regimen: Secondary | ICD-10-CM | POA: Diagnosis not present

## 2016-11-19 DIAGNOSIS — L89159 Pressure ulcer of sacral region, unspecified stage: Secondary | ICD-10-CM | POA: Diagnosis present

## 2016-11-19 DIAGNOSIS — Z5329 Procedure and treatment not carried out because of patient's decision for other reasons: Secondary | ICD-10-CM | POA: Diagnosis present

## 2016-11-19 DIAGNOSIS — R0902 Hypoxemia: Secondary | ICD-10-CM | POA: Diagnosis present

## 2016-11-19 DIAGNOSIS — R918 Other nonspecific abnormal finding of lung field: Secondary | ICD-10-CM | POA: Diagnosis not present

## 2016-11-19 DIAGNOSIS — Z7984 Long term (current) use of oral hypoglycemic drugs: Secondary | ICD-10-CM | POA: Diagnosis not present

## 2016-11-19 DIAGNOSIS — R042 Hemoptysis: Secondary | ICD-10-CM | POA: Diagnosis not present

## 2016-11-19 DIAGNOSIS — J9600 Acute respiratory failure, unspecified whether with hypoxia or hypercapnia: Secondary | ICD-10-CM | POA: Diagnosis not present

## 2016-11-19 DIAGNOSIS — E46 Unspecified protein-calorie malnutrition: Secondary | ICD-10-CM | POA: Diagnosis present

## 2016-11-19 DIAGNOSIS — R634 Abnormal weight loss: Secondary | ICD-10-CM | POA: Diagnosis not present

## 2016-11-19 DIAGNOSIS — E1165 Type 2 diabetes mellitus with hyperglycemia: Secondary | ICD-10-CM | POA: Diagnosis present

## 2016-11-19 DIAGNOSIS — S0990XA Unspecified injury of head, initial encounter: Secondary | ICD-10-CM | POA: Diagnosis not present

## 2016-11-19 DIAGNOSIS — F419 Anxiety disorder, unspecified: Secondary | ICD-10-CM | POA: Diagnosis present

## 2016-11-19 DIAGNOSIS — Z681 Body mass index (BMI) 19 or less, adult: Secondary | ICD-10-CM | POA: Diagnosis not present

## 2016-11-19 DIAGNOSIS — J690 Pneumonitis due to inhalation of food and vomit: Secondary | ICD-10-CM | POA: Diagnosis present

## 2016-11-19 DIAGNOSIS — Z9114 Patient's other noncompliance with medication regimen: Secondary | ICD-10-CM | POA: Diagnosis not present

## 2016-11-19 DIAGNOSIS — I10 Essential (primary) hypertension: Secondary | ICD-10-CM | POA: Diagnosis present

## 2016-11-19 DIAGNOSIS — Z79899 Other long term (current) drug therapy: Secondary | ICD-10-CM | POA: Diagnosis not present

## 2016-11-19 DIAGNOSIS — E119 Type 2 diabetes mellitus without complications: Secondary | ICD-10-CM | POA: Diagnosis not present

## 2016-11-24 DIAGNOSIS — I10 Essential (primary) hypertension: Secondary | ICD-10-CM | POA: Diagnosis not present

## 2016-11-24 DIAGNOSIS — R1312 Dysphagia, oropharyngeal phase: Secondary | ICD-10-CM | POA: Diagnosis not present

## 2016-11-24 DIAGNOSIS — A419 Sepsis, unspecified organism: Secondary | ICD-10-CM | POA: Diagnosis not present

## 2016-11-24 DIAGNOSIS — J9601 Acute respiratory failure with hypoxia: Secondary | ICD-10-CM | POA: Diagnosis not present

## 2016-11-24 DIAGNOSIS — E1165 Type 2 diabetes mellitus with hyperglycemia: Secondary | ICD-10-CM | POA: Diagnosis not present

## 2016-11-24 DIAGNOSIS — E11649 Type 2 diabetes mellitus with hypoglycemia without coma: Secondary | ICD-10-CM | POA: Diagnosis present

## 2016-11-24 DIAGNOSIS — E43 Unspecified severe protein-calorie malnutrition: Secondary | ICD-10-CM | POA: Diagnosis not present

## 2016-11-24 DIAGNOSIS — J690 Pneumonitis due to inhalation of food and vomit: Secondary | ICD-10-CM | POA: Diagnosis present

## 2016-11-24 DIAGNOSIS — Z66 Do not resuscitate: Secondary | ICD-10-CM | POA: Diagnosis present

## 2016-11-24 DIAGNOSIS — E7800 Pure hypercholesterolemia, unspecified: Secondary | ICD-10-CM | POA: Diagnosis present

## 2016-11-24 DIAGNOSIS — J189 Pneumonia, unspecified organism: Secondary | ICD-10-CM | POA: Diagnosis not present

## 2016-11-24 DIAGNOSIS — I469 Cardiac arrest, cause unspecified: Secondary | ICD-10-CM | POA: Diagnosis not present

## 2016-11-24 DIAGNOSIS — T68XXXA Hypothermia, initial encounter: Secondary | ICD-10-CM | POA: Diagnosis not present

## 2016-11-24 DIAGNOSIS — Z794 Long term (current) use of insulin: Secondary | ICD-10-CM | POA: Diagnosis not present

## 2016-11-24 DIAGNOSIS — Z9114 Patient's other noncompliance with medication regimen: Secondary | ICD-10-CM | POA: Diagnosis not present

## 2016-11-24 DIAGNOSIS — B998 Other infectious disease: Secondary | ICD-10-CM | POA: Diagnosis not present

## 2016-11-24 MED ORDER — ACETAMINOPHEN 325 MG PO TABS
650.00 | ORAL_TABLET | ORAL | Status: DC
Start: ? — End: 2016-11-24

## 2016-11-24 MED ORDER — MORPHINE SULFATE 2 MG/ML IJ SOLN
1.00 | INTRAMUSCULAR | Status: DC
Start: ? — End: 2016-11-24

## 2016-11-24 MED ORDER — ENOXAPARIN SODIUM 40 MG/0.4ML ~~LOC~~ SOLN
40.00 | SUBCUTANEOUS | Status: DC
Start: 2016-11-25 — End: 2016-11-24

## 2016-11-24 MED ORDER — ALUMINUM-MAGNESIUM-SIMETHICONE 200-200-20 MG/5ML PO SUSP
30.00 | ORAL | Status: DC
Start: ? — End: 2016-11-24

## 2016-11-24 MED ORDER — GENERIC EXTERNAL MEDICATION
1.00 | Status: DC
Start: ? — End: 2016-11-24

## 2016-11-24 MED ORDER — GLUCOSE 40 % PO GEL
1.00 | ORAL | Status: DC
Start: ? — End: 2016-11-24

## 2016-11-24 MED ORDER — GENERIC EXTERNAL MEDICATION
5.00 | Status: DC
Start: 2016-11-24 — End: 2016-11-24

## 2016-11-24 MED ORDER — IPRATROPIUM-ALBUTEROL 0.5-2.5 (3) MG/3ML IN SOLN
3.00 | RESPIRATORY_TRACT | Status: DC
Start: 2016-11-24 — End: 2016-11-24

## 2016-11-24 MED ORDER — HYDRALAZINE HCL 10 MG PO TABS
10.00 | ORAL_TABLET | ORAL | Status: DC
Start: 2016-11-24 — End: 2016-11-24

## 2016-11-24 MED ORDER — SODIUM CHLORIDE 3 % IN NEBU
3.00 | INHALATION_SOLUTION | RESPIRATORY_TRACT | Status: DC
Start: 2016-11-24 — End: 2016-11-24

## 2016-11-24 MED ORDER — ALBUTEROL SULFATE (2.5 MG/3ML) 0.083% IN NEBU
2.50 | INHALATION_SOLUTION | RESPIRATORY_TRACT | Status: DC
Start: ? — End: 2016-11-24

## 2016-11-24 MED ORDER — HYDRALAZINE HCL 20 MG/ML IJ SOLN
10.00 | INTRAMUSCULAR | Status: DC
Start: ? — End: 2016-11-24

## 2016-11-24 MED ORDER — GENERIC EXTERNAL MEDICATION
4.50 | Status: DC
Start: 2016-11-24 — End: 2016-11-24

## 2016-11-24 MED ORDER — INSULIN ASPART 100 UNIT/ML FLEXPEN
PEN_INJECTOR | SUBCUTANEOUS | Status: DC
Start: ? — End: 2016-11-24

## 2016-11-24 MED ORDER — GENERIC EXTERNAL MEDICATION
Status: DC
Start: ? — End: 2016-11-24

## 2016-11-24 MED ORDER — GENERIC EXTERNAL MEDICATION
2.00 | Status: DC
Start: 2016-11-24 — End: 2016-11-24

## 2016-11-24 MED ORDER — SODIUM CHLORIDE 0.65 % NA SOLN
1.00 | NASAL | Status: DC
Start: ? — End: 2016-11-24

## 2016-11-24 MED ORDER — INSULIN ASPART 100 UNIT/ML FLEXPEN
PEN_INJECTOR | SUBCUTANEOUS | Status: DC
Start: 2016-11-24 — End: 2016-11-24

## 2016-11-24 MED ORDER — LORAZEPAM 1 MG PO TABS
1.00 | ORAL_TABLET | ORAL | Status: DC
Start: ? — End: 2016-11-24

## 2016-11-24 MED ORDER — PREDNISONE 20 MG PO TABS
40.00 | ORAL_TABLET | ORAL | Status: DC
Start: 2016-11-25 — End: 2016-11-24

## 2016-11-24 MED ORDER — ENOXAPARIN SODIUM 40 MG/0.4ML ~~LOC~~ SOLN
40.00 | SUBCUTANEOUS | Status: DC
Start: 2016-11-24 — End: 2016-11-24

## 2016-11-24 MED ORDER — MAGNESIUM HYDROXIDE 400 MG/5ML PO SUSP
30.00 | ORAL | Status: DC
Start: ? — End: 2016-11-24

## 2016-11-24 MED ORDER — DEXTROSE 50 % IV SOLN
25.00 | INTRAVENOUS | Status: DC
Start: ? — End: 2016-11-24

## 2016-11-24 MED ORDER — ONDANSETRON HCL 4 MG/2ML IJ SOLN
4.00 | INTRAMUSCULAR | Status: DC
Start: ? — End: 2016-11-24

## 2017-12-17 IMAGING — CT CT ABD-PELV W/ CM
2 of 5 series · 16 of 46 positions shown, 18 images · IV contrast (Omni 300)
Comparison: Single-view of the chest 04/02/2016. PA and lateral
chest 01/22/2015.

CLINICAL DATA: Diarrhea for over 2 weeks.

EXAM:
CT ABDOMEN AND PELVIS WITH CONTRAST
TECHNIQUE: Multidetector CT imaging of the abdomen and pelvis was performed
using the standard protocol following bolus administration of
intravenous contrast.
CONTRAST:  100 ml CITUX3-IXX IOPAMIDOL (CITUX3-IXX) INJECTION 61%

[Series 3: a/p w/ 5mm · axial · 0.77mm/px · z∈[+865,+1285]mm · 13 of 94 slices shown, 15 images]
[im 5/94  soft-tissue]
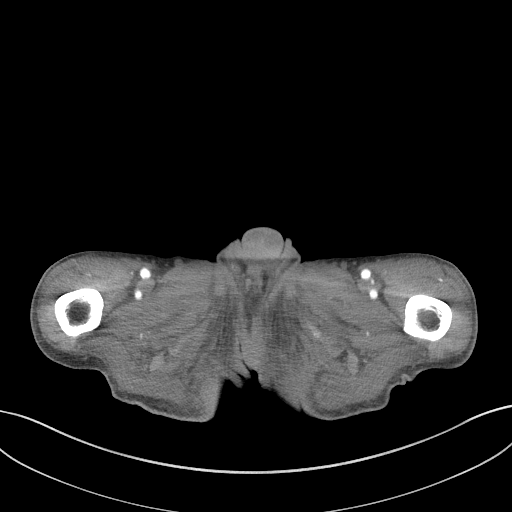
[im 5/94  bone]
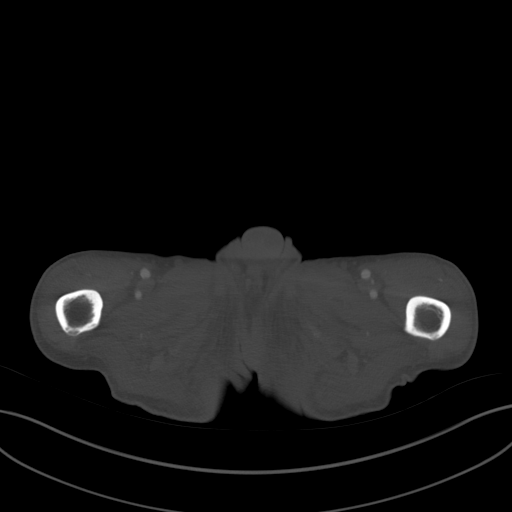
[im 15/94  soft-tissue]
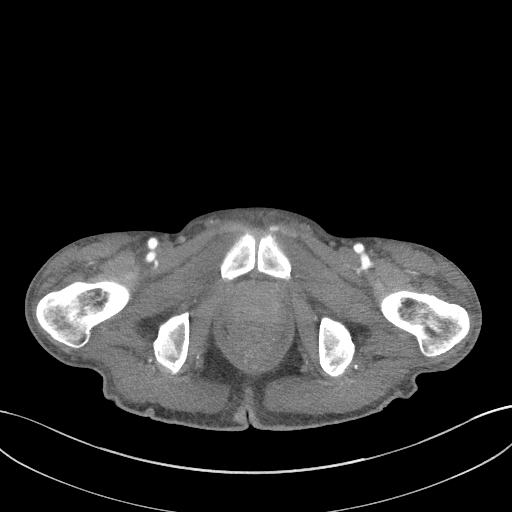
[im 20/94  soft-tissue]
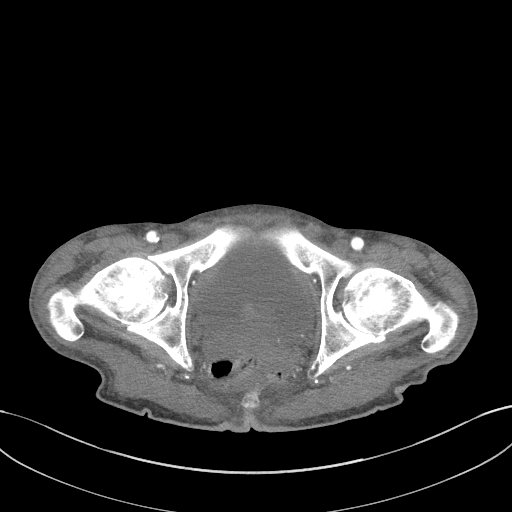
[im 25/94  soft-tissue]
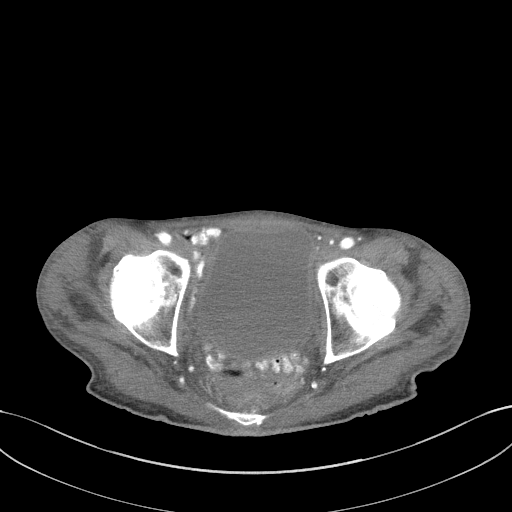
[im 35/94  soft-tissue]
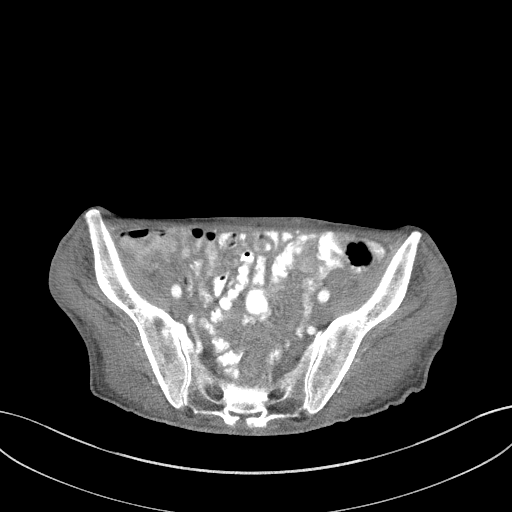
[im 40/94  soft-tissue]
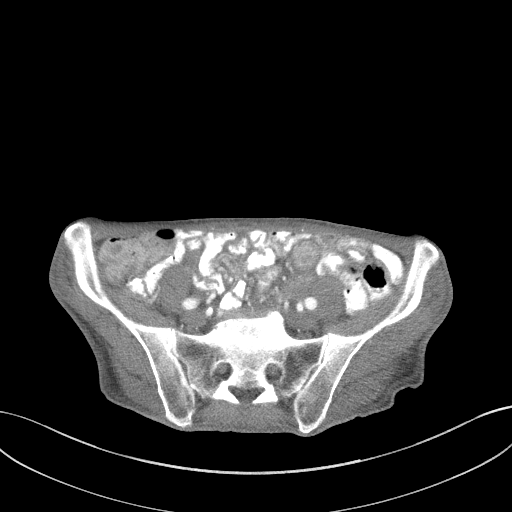
[im 49/94  soft-tissue]
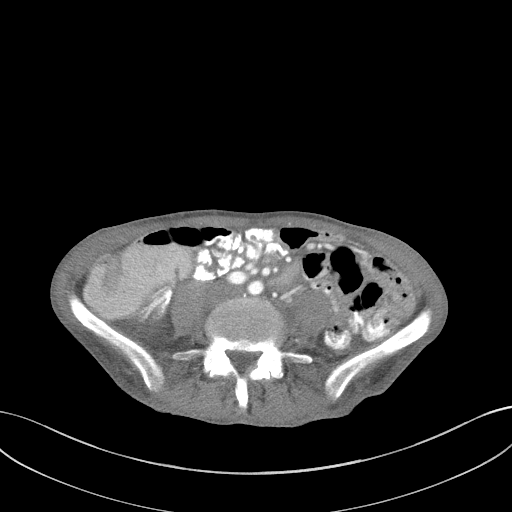
[im 54/94  soft-tissue]
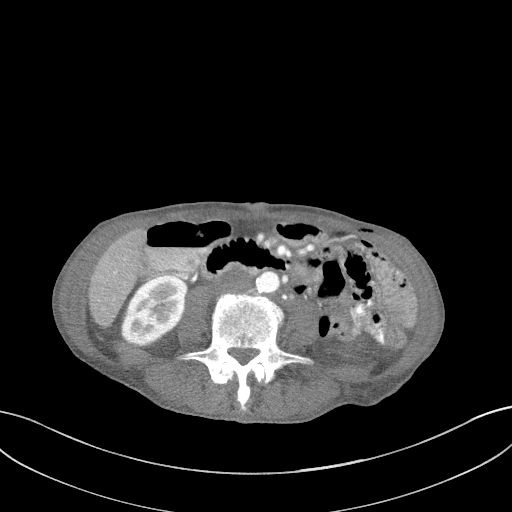
[im 59/94  soft-tissue]
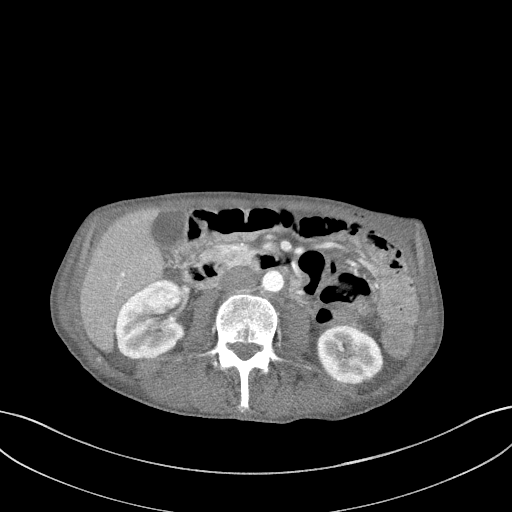
[im 59/94  bone]
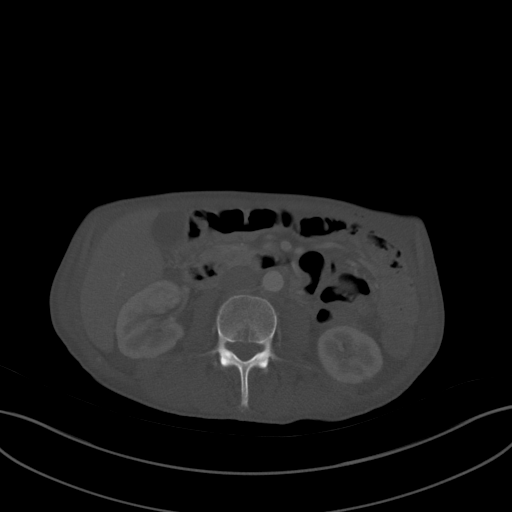
[im 69/94  soft-tissue]
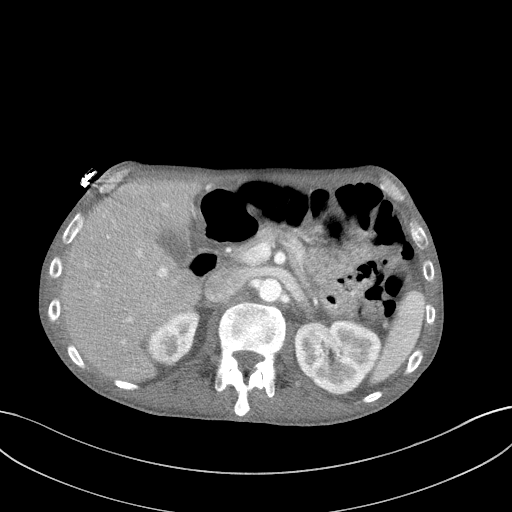
[im 74/94  soft-tissue]
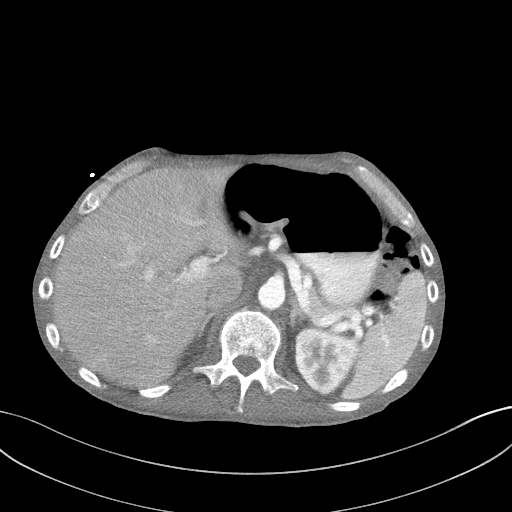
[im 79/94  soft-tissue]
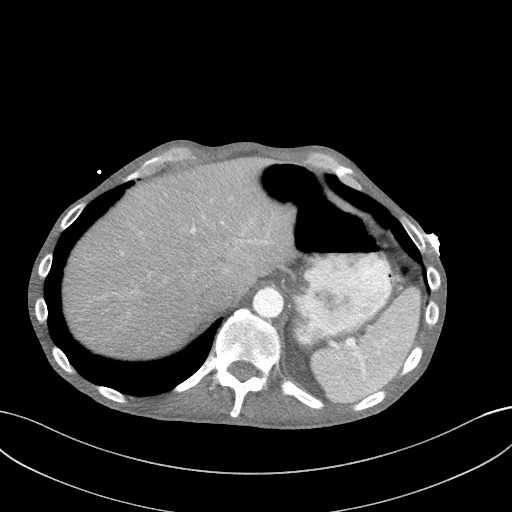
[im 89/94  soft-tissue]
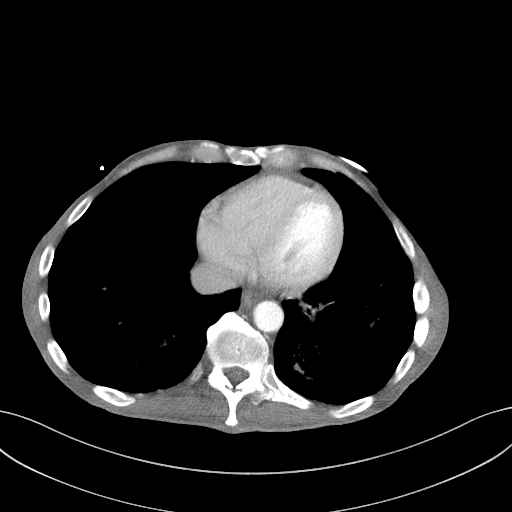

[Series 7: a/p w/ cor · coronal · 0.66mm/px · 3 of 113 slices shown]
[im 38/113  soft-tissue]
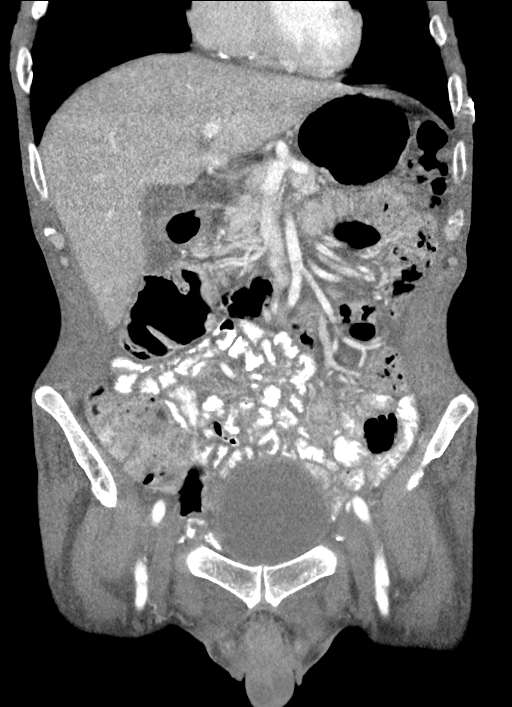
[im 50/113  soft-tissue]
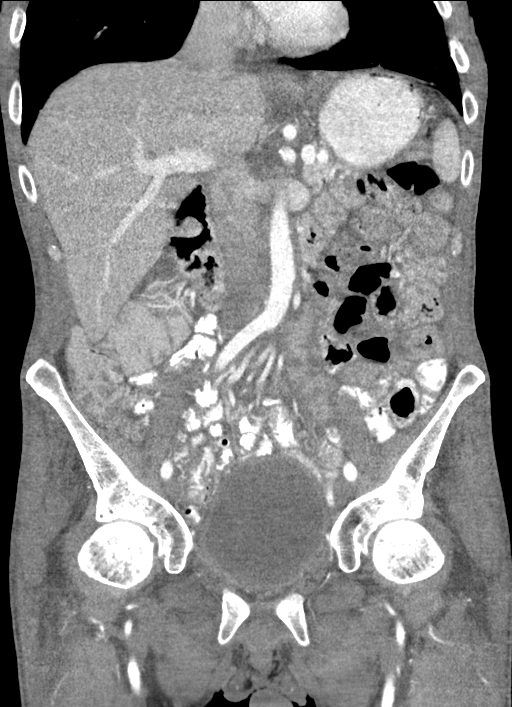
[im 63/113  soft-tissue]
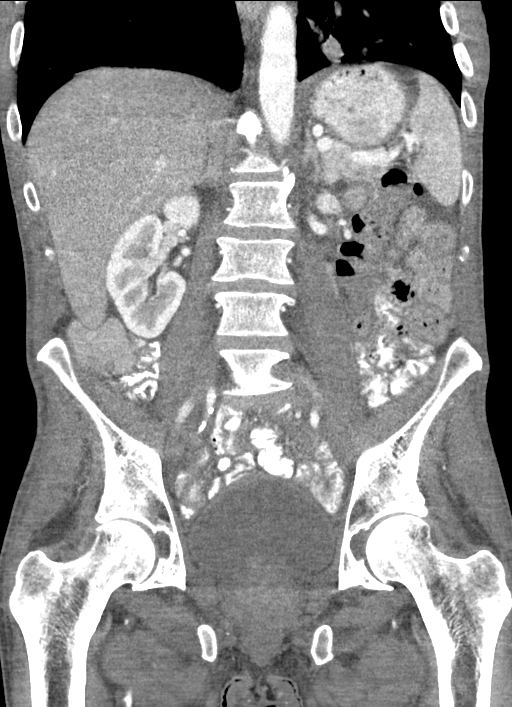

[16 of 46 positions shown; findings below may reference images not displayed]

FINDINGS: Lower chest: There is no pleural or pericardial effusion. Bilateral
lower lobe airspace disease has an appearance most worrisome for
bronchopneumonia.

Hepatobiliary: No focal liver abnormality is seen. No gallstones,
gallbladder wall thickening, or biliary dilatation.

Pancreas: Unremarkable. No pancreatic ductal dilatation or
surrounding inflammatory changes.

Spleen: Normal in size without focal abnormality.

Adrenals/Urinary Tract: Adrenal glands are unremarkable. Kidneys are
normal, without renal calculi, focal lesion, or hydronephrosis.
Bladder is unremarkable.

Stomach/Bowel: Stomach is within normal limits. Appendix appears
normal. No evidence of bowel wall thickening, distention, or
inflammatory changes.

Vascular/Lymphatic: A few scattered aortoiliac atherosclerotic
calcifications without aneurysm are identified.

Reproductive: Prostate gland is prominent.

Other: There is diffuse body wall and mesenteric edema. No focal
fluid collection is identified. The patient appears cachectic.

Musculoskeletal: No acute or focal bony abnormality is identified.
IMPRESSION: Bilateral lower lobe airspace disease with an appearance most
worrisome for pneumonia.

Negative for colitis.

Body wall and mesenteric edema most consistent with anasarca.

Atherosclerosis.
